# Patient Record
Sex: Female | Born: 1955
Health system: Southern US, Community
[De-identification: ages and names within clinical notes are randomized; demographics above are authoritative.]

## PROBLEM LIST (undated history)

## (undated) DIAGNOSIS — C439 Malignant melanoma of skin, unspecified: Secondary | ICD-10-CM

## (undated) DIAGNOSIS — E785 Hyperlipidemia, unspecified: Secondary | ICD-10-CM

## (undated) DIAGNOSIS — I10 Essential (primary) hypertension: Secondary | ICD-10-CM

## (undated) DIAGNOSIS — R7303 Prediabetes: Secondary | ICD-10-CM

## (undated) DIAGNOSIS — M8580 Other specified disorders of bone density and structure, unspecified site: Secondary | ICD-10-CM

## (undated) DIAGNOSIS — Z800 Family history of malignant neoplasm of digestive organs: Secondary | ICD-10-CM

## (undated) DIAGNOSIS — Q899 Congenital malformation, unspecified: Secondary | ICD-10-CM

## (undated) HISTORY — DX: Hyperlipidemia, unspecified: E78.5

## (undated) HISTORY — DX: Congenital malformation, unspecified: Q89.9

## (undated) HISTORY — DX: Essential (primary) hypertension: I10

## (undated) HISTORY — DX: Malignant melanoma of skin, unspecified: C43.9

## (undated) HISTORY — DX: Family history of malignant neoplasm of digestive organs: Z80.0

## (undated) HISTORY — DX: Other specified disorders of bone density and structure, unspecified site: M85.80

## (undated) HISTORY — DX: Prediabetes: R73.03

---

## 1975-04-17 DIAGNOSIS — K759 Inflammatory liver disease, unspecified: Secondary | ICD-10-CM

## 1975-04-17 HISTORY — DX: Inflammatory liver disease, unspecified: K75.9

## 2006-08-16 ENCOUNTER — Emergency Department: Payer: Self-pay | Admitting: Emergency Medicine

## 2015-01-31 HISTORY — PX: COLONOSCOPY: SHX174

## 2017-06-10 DIAGNOSIS — M7742 Metatarsalgia, left foot: Secondary | ICD-10-CM | POA: Insufficient documentation

## 2017-06-10 DIAGNOSIS — R202 Paresthesia of skin: Secondary | ICD-10-CM | POA: Insufficient documentation

## 2017-06-10 DIAGNOSIS — M7741 Metatarsalgia, right foot: Secondary | ICD-10-CM | POA: Insufficient documentation

## 2019-07-07 DIAGNOSIS — Z1231 Encounter for screening mammogram for malignant neoplasm of breast: Secondary | ICD-10-CM | POA: Diagnosis not present

## 2019-07-30 DIAGNOSIS — N3001 Acute cystitis with hematuria: Secondary | ICD-10-CM | POA: Diagnosis not present

## 2019-08-01 ENCOUNTER — Other Ambulatory Visit: Payer: Self-pay

## 2019-08-01 DIAGNOSIS — R112 Nausea with vomiting, unspecified: Secondary | ICD-10-CM | POA: Diagnosis not present

## 2019-08-01 DIAGNOSIS — R197 Diarrhea, unspecified: Secondary | ICD-10-CM | POA: Insufficient documentation

## 2019-08-01 LAB — CBC
HCT: 47.2 % — ABNORMAL HIGH (ref 36.0–46.0)
Hemoglobin: 15.5 g/dL — ABNORMAL HIGH (ref 12.0–15.0)
MCH: 31 pg (ref 26.0–34.0)
MCHC: 32.8 g/dL (ref 30.0–36.0)
MCV: 94.4 fL (ref 80.0–100.0)
Platelets: 328 10*3/uL (ref 150–400)
RBC: 5 MIL/uL (ref 3.87–5.11)
RDW: 13.2 % (ref 11.5–15.5)
WBC: 13.3 10*3/uL — ABNORMAL HIGH (ref 4.0–10.5)
nRBC: 0 % (ref 0.0–0.2)

## 2019-08-01 NOTE — ED Triage Notes (Signed)
Patient reports nausea, vomiting and diarrhea that started after eating supper around 8 pm.

## 2019-08-02 ENCOUNTER — Emergency Department
Admission: EM | Admit: 2019-08-02 | Discharge: 2019-08-02 | Disposition: A | Discharge status: Home / Self Care | Payer: Medicare PPO | Attending: Emergency Medicine | Admitting: Emergency Medicine

## 2019-08-02 DIAGNOSIS — R112 Nausea with vomiting, unspecified: Secondary | ICD-10-CM

## 2019-08-02 DIAGNOSIS — R197 Diarrhea, unspecified: Secondary | ICD-10-CM

## 2019-08-02 LAB — URINALYSIS, COMPLETE (UACMP) WITH MICROSCOPIC
Bacteria, UA: NONE SEEN
Bilirubin Urine: NEGATIVE
Glucose, UA: NEGATIVE mg/dL
Hgb urine dipstick: NEGATIVE
Ketones, ur: NEGATIVE mg/dL
Nitrite: NEGATIVE
Protein, ur: 30 mg/dL — AB
Specific Gravity, Urine: 1.026 (ref 1.005–1.030)
pH: 5 (ref 5.0–8.0)

## 2019-08-02 LAB — COMPREHENSIVE METABOLIC PANEL
ALT: 39 U/L (ref 0–44)
AST: 35 U/L (ref 15–41)
Albumin: 4.6 g/dL (ref 3.5–5.0)
Alkaline Phosphatase: 94 U/L (ref 38–126)
Anion gap: 14 (ref 5–15)
BUN: 26 mg/dL — ABNORMAL HIGH (ref 8–23)
CO2: 23 mmol/L (ref 22–32)
Calcium: 9.6 mg/dL (ref 8.9–10.3)
Chloride: 103 mmol/L (ref 98–111)
Creatinine, Ser: 1.3 mg/dL — ABNORMAL HIGH (ref 0.44–1.00)
GFR calc Af Amer: 50 mL/min — ABNORMAL LOW (ref >60)
GFR calc non Af Amer: 43 mL/min — ABNORMAL LOW (ref >60)
Glucose, Bld: 171 mg/dL — ABNORMAL HIGH (ref 70–99)
Potassium: 4.4 mmol/L (ref 3.5–5.1)
Sodium: 140 mmol/L (ref 135–145)
Total Bilirubin: 1 mg/dL (ref 0.3–1.2)
Total Protein: 8.3 g/dL — ABNORMAL HIGH (ref 6.5–8.1)

## 2019-08-02 LAB — LIPASE, BLOOD: Lipase: 54 U/L — ABNORMAL HIGH (ref 11–51)

## 2019-08-02 MED ORDER — ONDANSETRON HCL 4 MG/2ML IJ SOLN
4.0000 mg | Freq: Once | INTRAMUSCULAR | Status: AC
  Administered 2019-08-02: 4 mg via INTRAVENOUS

## 2019-08-02 MED ORDER — ONDANSETRON 4 MG PO TBDP: 4.0000 mg | ORAL_TABLET | Freq: Three times a day (TID) | ORAL | 0 refills | Status: DC | PRN

## 2019-08-02 MED ORDER — SODIUM CHLORIDE 0.9 % IV BOLUS: 1000.0000 mL | Freq: Once | INTRAVENOUS | Status: DC

## 2019-08-02 MED ORDER — ONDANSETRON HCL 4 MG/2ML IJ SOLN
4.0000 mg | Freq: Once | INTRAMUSCULAR | Status: DC
  Filled 2019-08-02: qty 2

## 2019-08-02 MED ORDER — SODIUM CHLORIDE 0.9 % IV BOLUS
1000.0000 mL | Freq: Once | INTRAVENOUS | Status: AC
  Administered 2019-08-02: 1000 mL via INTRAVENOUS

## 2019-08-02 NOTE — ED Notes (Signed)
Pt given ice chips to tolerate.

## 2019-08-02 NOTE — ED Provider Notes (Signed)
[facility] Emergency Department Provider Note  ____________________________________________   First MD Initiated Contact with Patient 08/02/19 [phone]     (approximate)  I have reviewed the triage vital signs and the nursing notes.   HISTORY  Chief Complaint Emesis and Nausea   HPI Maygen Suddith is a 64 y.o. female presents to the emergency department due to acute onset of nausea nonbloody emesis and diarrhea that began after eating steak out tonight at 8 PM.  Patient denies any abdominal pain no fever.  Patient denies any urinary symptoms.  Patient has not had any emesis emesis or diarrhea while in the emergency department.        No past medical history on file.  There are no problems to display for this patient.    Prior to Admission medications   Medication Sig Start Date End Date Taking? Authorizing Provider  ondansetron (ZOFRAN ODT) 4 MG disintegrating tablet Take 1 tablet (4 mg total) by mouth every 8 (eight) hours as needed. 08/02/19   Gregor Hams, MD    Allergies Streptomycin  No family history on file.  Social History Social History   Tobacco Use  . Smoking status: Not on file  Substance Use Topics  . Alcohol use: Not on file  . Drug use: Not on file    Review of Systems Constitutional: No fever/chills Eyes: No visual changes. ENT: No sore throat. Cardiovascular: Denies chest pain. Respiratory: Denies shortness of breath. Gastrointestinal: No abdominal pain, positive for nausea vomiting and diarrhea Genitourinary: Negative for dysuria. Musculoskeletal: Negative for neck pain.  Negative for back pain. Integumentary: Negative for rash. Neurological: Negative for headaches, focal weakness or numbness. ____________________   PHYSICAL EXAM:  VITAL SIGNS: ED Triage Vitals  Enc Vitals Group     BP 08/01/19 2341 124/68     Pulse Rate 08/01/19 2341 94     Resp 08/01/19 2341 16     Temp 08/01/19 2341 98.1 F (36.7  C)     Temp Source 08/01/19 2341 Oral     SpO2 08/01/19 2341 99 %     Weight 08/01/19 2342 81.6 kg (180 lb)     Height 08/01/19 2342 1.626 m (5\' 4" )     Head Circumference --      Peak Flow --      Pain Score 08/02/19 0641 0     Pain Loc --      Pain Edu? --      Excl. in [place]? --     Constitutional: Alert and oriented.  Eyes: Conjunctivae are normal.  Mouth/Throat: Patient is wearing a mask. Neck: No stridor.  No meningeal signs.   Cardiovascular: Normal rate, regular rhythm. Good peripheral circulation. Grossly normal heart sounds. Respiratory: Normal respiratory effort.  No retractions. Gastrointestinal: Soft and nontender. No distention.  Musculoskeletal: No lower extremity tenderness nor edema. No gross deformities of extremities. Neurologic:  Normal speech and language. No gross focal neurologic deficits are appreciated.  Skin:  Skin is warm, dry and intact. Psychiatric: Mood and affect are normal. Speech and behavior are normal.  ____________________________________________   LABS (all labs ordered are listed, but only abnormal results are displayed)  Labs Reviewed  LIPASE, BLOOD - Abnormal; Notable for the following components:      Result Value   Lipase 54 (*)    All other components within normal limits  COMPREHENSIVE METABOLIC PANEL - Abnormal; Notable for the following components:   Glucose, Bld 171 (*)    BUN  26 (*)    Creatinine, Ser 1.30 (*)    Total Protein 8.3 (*)    GFR calc non Af Amer 43 (*)    GFR calc Af Amer 50 (*)    All other components within normal limits  CBC - Abnormal; Notable for the following components:   WBC 13.3 (*)    Hemoglobin 15.5 (*)    HCT 47.2 (*)    All other components within normal limits  URINALYSIS, COMPLETE (UACMP) WITH MICROSCOPIC - Abnormal; Notable for the following components:   Color, Urine YELLOW (*)    APPearance HAZY (*)    Protein, ur 30 (*)    Leukocytes,Ua SMALL (*)    Non Squamous Epithelial PRESENT (*)      All other components within normal limits     Procedures   ____________________________________________   INITIAL IMPRESSION / MDM / ASSESSMENT AND PLAN / ED COURSE  As part of my medical decision making, I reviewed the following data within the electronic MEDICAL RECORD NUMBER   64 year old female presented with above-stated history and physical exam a differential diagnosis including but not limited to gastroenteritis of an infectious etiology.  Patient with no further vomiting or diarrhea while in the emergency department and as such stool sample was not able to be obtained.  Patient received IV Zofran 4 mg and 1 L IV normal saline with complete resolution of nausea.  Patient stating that she feels better at this time. ____________________________________________  FINAL CLINICAL IMPRESSION(S) / ED DIAGNOSES  Final diagnoses:  Nausea vomiting and diarrhea     MEDICATIONS GIVEN DURING THIS VISIT:  Medications  sodium chloride 0.9 % bolus 1,000 mL (0 mLs Intravenous Stopped 08/02/19 0530)  ondansetron (ZOFRAN) injection 4 mg (4 mg Intravenous Given 08/02/19 0356)     ED Discharge Orders         Ordered    ondansetron (ZOFRAN ODT) 4 MG disintegrating tablet  Every 8 hours PRN     08/02/19 0622          *Please note:  Zonie Sirois was evaluated in Emergency Department on 08/02/2019 for the symptoms described in the history of present illness. She was evaluated in the context of the global COVID-19 pandemic, which necessitated consideration that the patient might be at risk for infection with the SARS-CoV-2 virus that causes COVID-19. Institutional protocols and algorithms that pertain to the evaluation of patients at risk for COVID-19 are in a state of rapid change based on information released by regulatory bodies including the CDC and federal and state organizations. These policies and algorithms were followed during the patient's care in the ED.  Some ED evaluations and  interventions may be delayed as a result of limited staffing during the pandemic.*  Note:  This document was prepared using Dragon voice recognition software and may include unintentional dictation errors.   Gregor Hams, MD 08/02/19 2242

## 2019-08-02 NOTE — ED Notes (Signed)
Pt out to desk with spouse who state they need to leave and attend to their dog. Pt's iv removed by melody. Pt informed that she has not been officially discharged and will not receive her prescription or discharge paper work if she chooses to leave. Pt verbalizes understanding.

## 2019-08-02 NOTE — ED Notes (Signed)
DC iv

## 2019-08-02 NOTE — ED Notes (Signed)
Pt to the er for nausea and vomiting. Pt denies abd pain. Pt reports eating a hot dog with mustard, chilli and slaw and then a chicken salad sandwich which she vomited both.

## 2019-08-13 DIAGNOSIS — R5383 Other fatigue: Secondary | ICD-10-CM | POA: Diagnosis not present

## 2019-08-13 DIAGNOSIS — R630 Anorexia: Secondary | ICD-10-CM | POA: Diagnosis not present

## 2019-08-13 DIAGNOSIS — R112 Nausea with vomiting, unspecified: Secondary | ICD-10-CM | POA: Diagnosis not present

## 2019-08-13 DIAGNOSIS — R197 Diarrhea, unspecified: Secondary | ICD-10-CM | POA: Diagnosis not present

## 2019-08-13 DIAGNOSIS — R42 Dizziness and giddiness: Secondary | ICD-10-CM | POA: Diagnosis not present

## 2019-08-13 DIAGNOSIS — Z87891 Personal history of nicotine dependence: Secondary | ICD-10-CM | POA: Diagnosis not present

## 2019-08-13 DIAGNOSIS — E860 Dehydration: Secondary | ICD-10-CM | POA: Diagnosis not present

## 2019-08-14 ENCOUNTER — Other Ambulatory Visit: Payer: Self-pay

## 2019-08-14 ENCOUNTER — Emergency Department
Admission: EM | Admit: 2019-08-14 | Discharge: 2019-08-15 | Disposition: A | Discharge status: Home / Self Care | Payer: Medicare PPO | Attending: Emergency Medicine | Admitting: Emergency Medicine

## 2019-08-14 DIAGNOSIS — Z6831 Body mass index (BMI) 31.0-31.9, adult: Secondary | ICD-10-CM | POA: Diagnosis not present

## 2019-08-14 DIAGNOSIS — R1030 Lower abdominal pain, unspecified: Secondary | ICD-10-CM | POA: Diagnosis not present

## 2019-08-14 DIAGNOSIS — R109 Unspecified abdominal pain: Secondary | ICD-10-CM | POA: Diagnosis not present

## 2019-08-14 DIAGNOSIS — R110 Nausea: Secondary | ICD-10-CM | POA: Diagnosis not present

## 2019-08-14 DIAGNOSIS — R630 Anorexia: Secondary | ICD-10-CM | POA: Diagnosis present

## 2019-08-14 LAB — CBC
HCT: 41.7 % (ref 36.0–46.0)
Hemoglobin: 14.1 g/dL (ref 12.0–15.0)
MCH: 31.1 pg (ref 26.0–34.0)
MCHC: 33.8 g/dL (ref 30.0–36.0)
MCV: 92.1 fL (ref 80.0–100.0)
Platelets: 274 10*3/uL (ref 150–400)
RBC: 4.53 MIL/uL (ref 3.87–5.11)
RDW: 13.2 % (ref 11.5–15.5)
WBC: 11.2 10*3/uL — ABNORMAL HIGH (ref 4.0–10.5)
nRBC: 0 % (ref 0.0–0.2)

## 2019-08-14 LAB — COMPREHENSIVE METABOLIC PANEL
ALT: 31 U/L (ref 0–44)
AST: 24 U/L (ref 15–41)
Albumin: 4.1 g/dL (ref 3.5–5.0)
Alkaline Phosphatase: 71 U/L (ref 38–126)
Anion gap: 12 (ref 5–15)
BUN: 15 mg/dL (ref 8–23)
CO2: 26 mmol/L (ref 22–32)
Calcium: 9.2 mg/dL (ref 8.9–10.3)
Chloride: 102 mmol/L (ref 98–111)
Creatinine, Ser: 1.08 mg/dL — ABNORMAL HIGH (ref 0.44–1.00)
GFR calc Af Amer: >60 mL/min (ref >60)
GFR calc non Af Amer: 53 mL/min — ABNORMAL LOW (ref >60)
Glucose, Bld: 113 mg/dL — ABNORMAL HIGH (ref 70–99)
Potassium: 3.7 mmol/L (ref 3.5–5.1)
Sodium: 140 mmol/L (ref 135–145)
Total Bilirubin: 1.1 mg/dL (ref 0.3–1.2)
Total Protein: 7 g/dL (ref 6.5–8.1)

## 2019-08-14 LAB — LIPASE, BLOOD: Lipase: 80 U/L — ABNORMAL HIGH (ref 11–51)

## 2019-08-14 MED ORDER — IOHEXOL 9 MG/ML PO SOLN
500.0000 mL | ORAL | Status: AC
  Administered 2019-08-14: 23:00:00 500 mL via ORAL

## 2019-08-14 NOTE — ED Provider Notes (Signed)
-----------------------------------------   11:20 PM on 08/14/2019 -----------------------------------------  Blood pressure (!) 108/49, pulse 66, temperature 98.1 F (36.7 C), temperature source Oral, resp. rate 18, height 5\' 4"  (1.626 m), weight 81.6 kg, SpO2 100 %.  Assuming care from Dr. Cherylann Banas.  In short, Leslie Woods is a 64 y.o. female with a chief complaint of Abdominal Pain .  Refer to the original H&P for additional details.  The current plan of care is to follow-up CT results for abdominal pain, may be discharged home if unremarkable.  ----------------------------------------- 1:11 AM on 08/15/2019 -----------------------------------------  CT scan is negative for acute process and remainder of lab work is unremarkable.  Patient describes her pain as a fullness particularly after eating and we will start her on a trial of PPI.  She was counseled to follow-up with her PCP and otherwise return to the ED for new worsening symptoms.  Patient agrees with plan.    Blake Divine, MD 08/15/19 0111

## 2019-08-14 NOTE — ED Notes (Signed)
Pt reports was on antibiotics recently for UTI. Pt reports was inconsistent with taking her antibiotic pills. Notified urine sample needed; states will notify this RN once she can provide sample. Bed locked low. Rail up. Call bell within reach.

## 2019-08-14 NOTE — ED Triage Notes (Signed)
Pt states two weeks of lower abd pain with nausea. Pt states was seen here 2 weeks ago for same and seen [facility] for same. Pt states took zofran pta. Pt appears in no acute distress.

## 2019-08-15 ENCOUNTER — Emergency Department: Payer: Medicare PPO

## 2019-08-15 DIAGNOSIS — R109 Unspecified abdominal pain: Secondary | ICD-10-CM | POA: Diagnosis not present

## 2019-08-15 LAB — URINALYSIS, COMPLETE (UACMP) WITH MICROSCOPIC
Bilirubin Urine: NEGATIVE
Glucose, UA: NEGATIVE mg/dL
Hgb urine dipstick: NEGATIVE
Ketones, ur: NEGATIVE mg/dL
Leukocytes,Ua: NEGATIVE
Nitrite: NEGATIVE
Protein, ur: NEGATIVE mg/dL
Specific Gravity, Urine: 1.005 (ref 1.005–1.030)
pH: 6 (ref 5.0–8.0)

## 2019-08-15 IMAGING — CT CT ABD-PELV W/ CM
2 of 5 series · 16 of 46 positions shown, 18 images · IV contrast (omnipaque)
Comparison: None.

CLINICAL DATA: Lower abdominal pain and nausea

EXAM:
CT ABDOMEN AND PELVIS WITH CONTRAST
TECHNIQUE: Multidetector CT imaging of the abdomen and pelvis was performed
using the standard protocol following bolus administration of
intravenous contrast.
CONTRAST:  100mL OMNIPAQUE IOHEXOL 300 MG/ML  SOLN

[Series 2: routine abd/pel with · axial · 0.87mm/px · z∈[-1063,-638]mm · 13 of 96 slices shown, 15 images]
[im 6/96  soft-tissue]
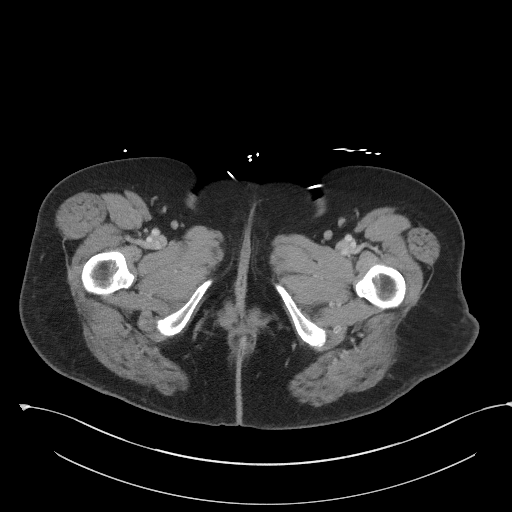
[im 6/96  bone]
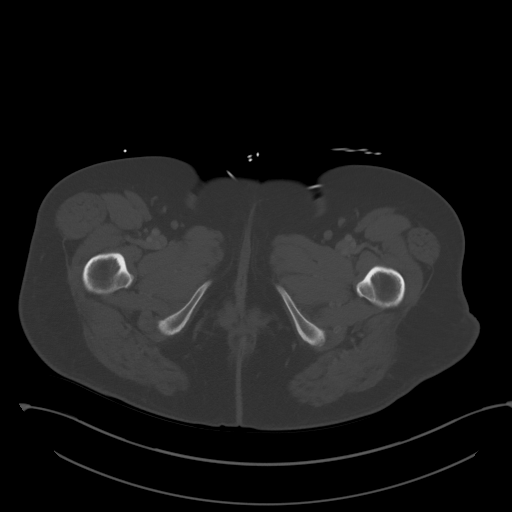
[im 16/96  soft-tissue]
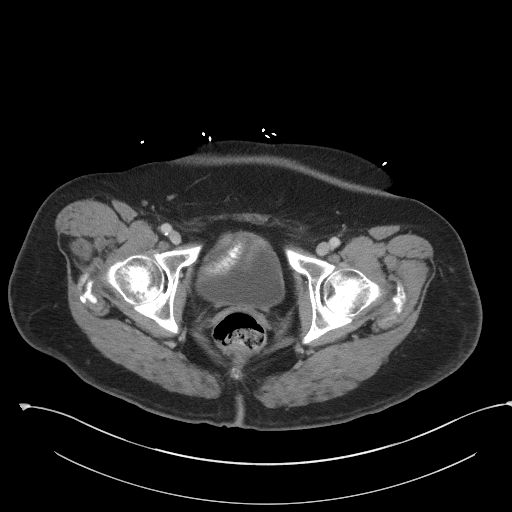
[im 21/96  soft-tissue]
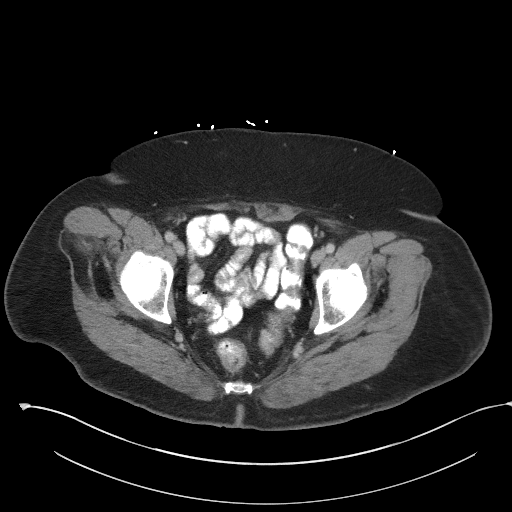
[im 26/96  soft-tissue]
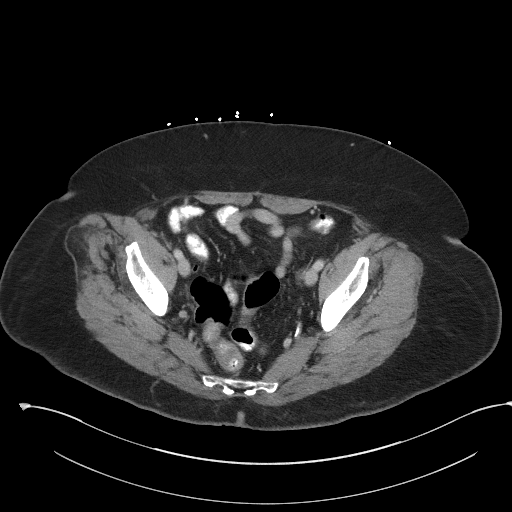
[im 36/96  soft-tissue]
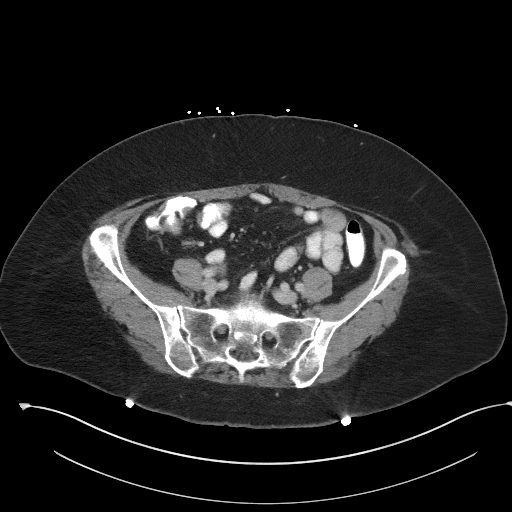
[im 41/96  soft-tissue]
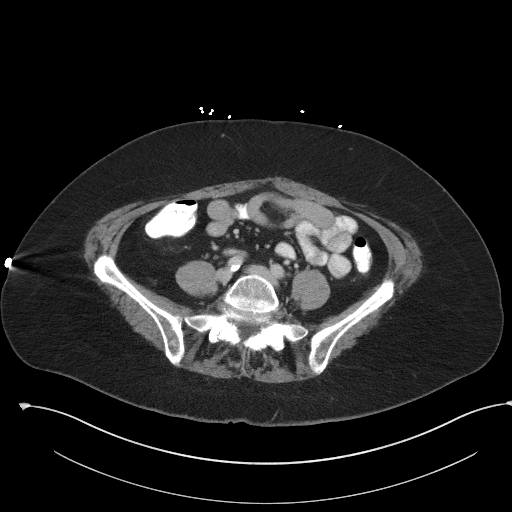
[im 51/96  soft-tissue]
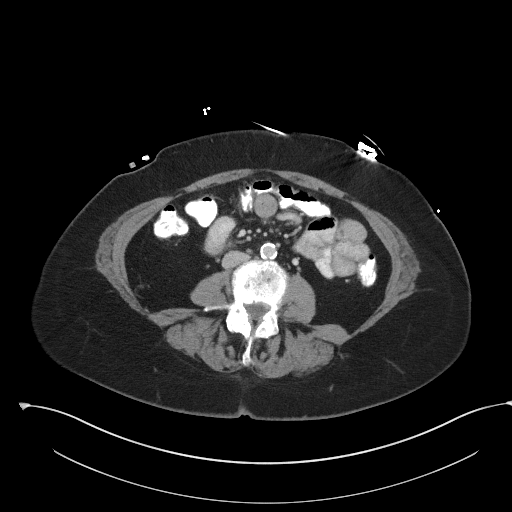
[im 56/96  soft-tissue]
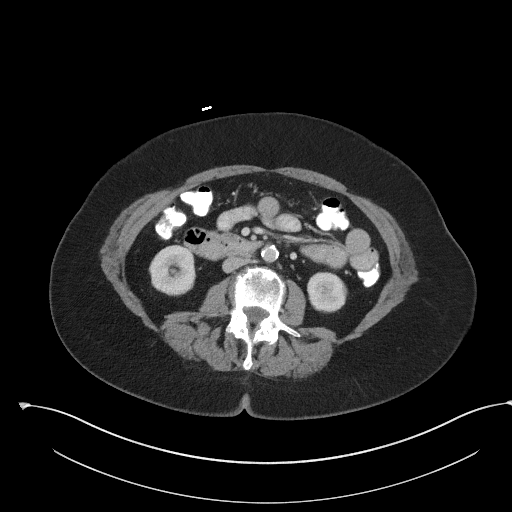
[im 61/96  soft-tissue]
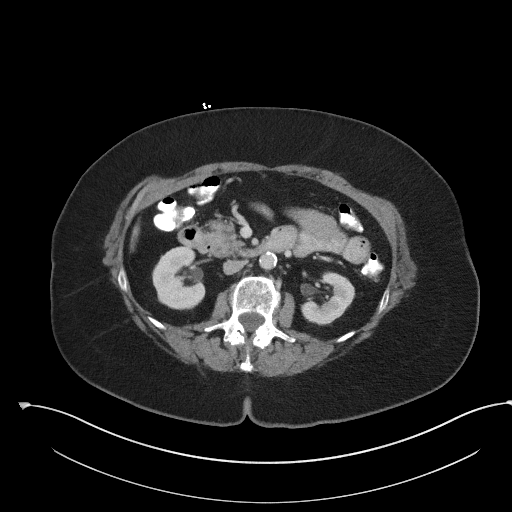
[im 61/96  bone]
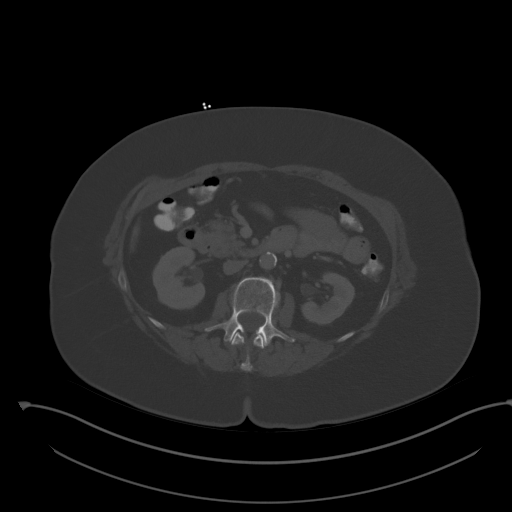
[im 71/96  soft-tissue]
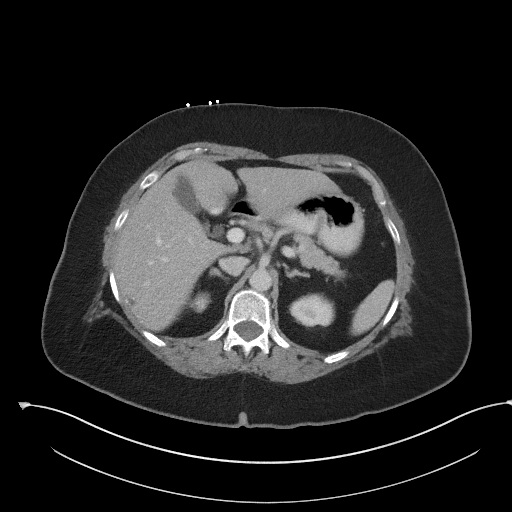
[im 76/96  soft-tissue]
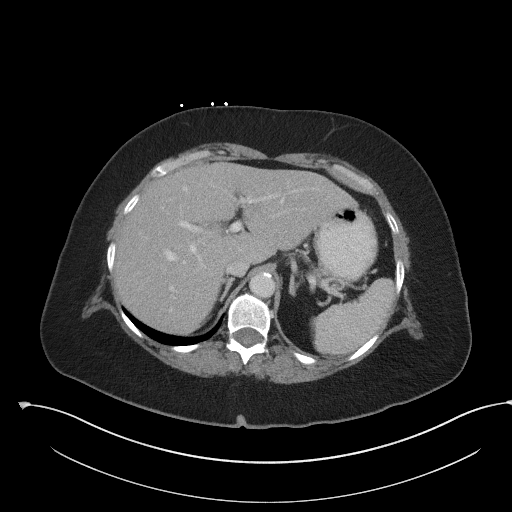
[im 81/96  soft-tissue]
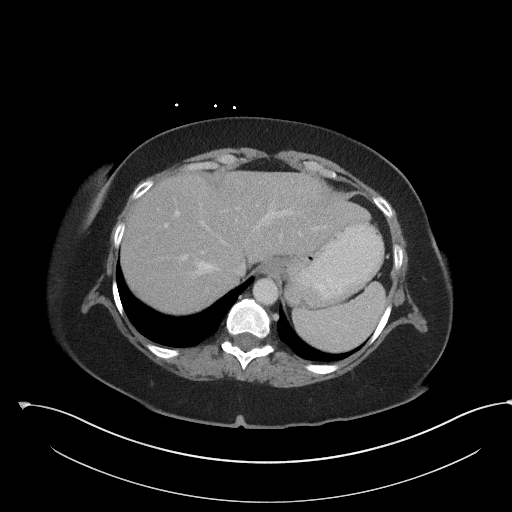
[im 91/96  soft-tissue]
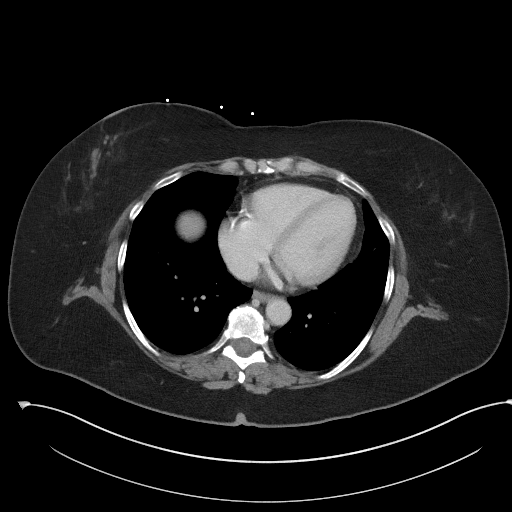

[Series 5: coronal st · coronal · 0.87mm/px · 3 of 87 slices shown]
[im 29/87  soft-tissue]
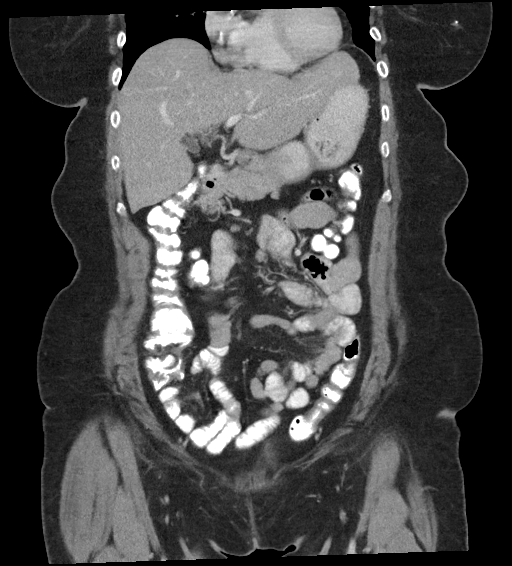
[im 39/87  soft-tissue]
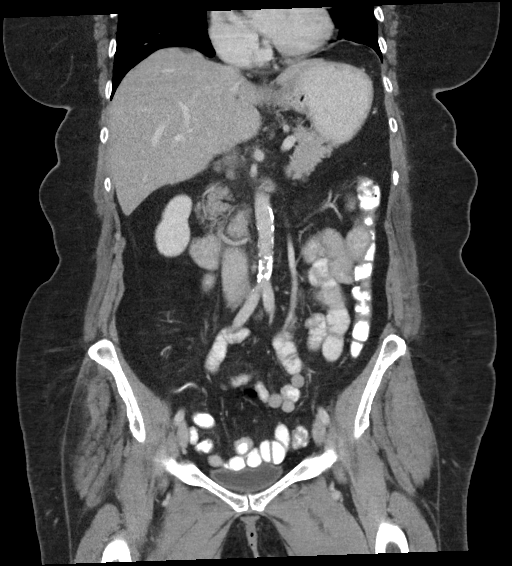
[im 48/87  soft-tissue]
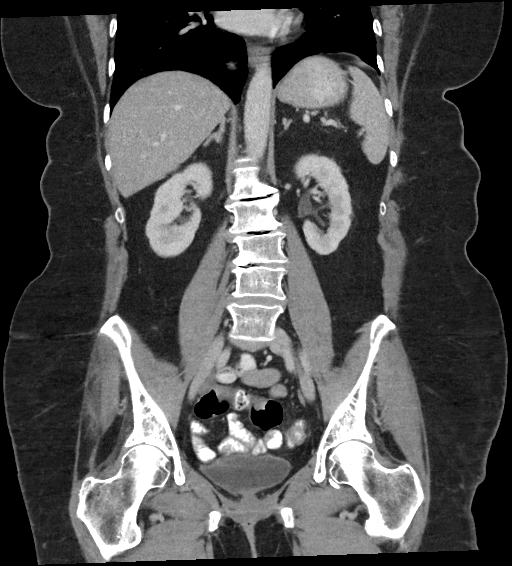

[16 of 46 positions shown; findings below may reference images not displayed]

FINDINGS: Lower chest: The visualized heart size within normal limits. No
pericardial fluid/thickening.

No hiatal hernia.

The visualized portions of the lungs are clear.

Hepatobiliary: The liver is normal in density without focal
abnormality.The main portal vein is patent. No evidence of calcified
gallstones, gallbladder wall thickening or biliary dilatation.

Pancreas:  The pancreas is unremarkable.

Spleen: Normal in size without focal abnormality.

Adrenals/Urinary Tract: Both adrenal glands appear normal. The
kidneys and collecting system appear normal without evidence of
urinary tract calculus or hydronephrosis. Bladder is unremarkable.

Stomach/Bowel: The stomach, small bowel, and colon are normal in
appearance. No inflammatory changes, wall thickening, or obstructive
findings.Scattered colonic diverticula are noted without
diverticulitis. The appendix is unremarkable.

Vascular/Lymphatic: There are no enlarged mesenteric,
retroperitoneal, or pelvic lymph nodes. Scattered aortic
atherosclerotic calcifications are seen without aneurysmal
dilatation.

Reproductive: The patient is status post hysterectomy. No adnexal
masses or collections seen.

Other: No evidence of abdominal wall mass or hernia.

Musculoskeletal: No acute or significant osseous findings.
IMPRESSION: No acute intra-abdominal or pelvic pathology to explain the
patient's symptoms.

Scattered colonic diverticula without diverticulitis.

Aortic Atherosclerosis ([8K]-[8K]).

## 2019-08-15 MED ORDER — IOHEXOL 300 MG/ML  SOLN
100.0000 mL | Freq: Once | INTRAMUSCULAR | Status: AC | PRN
  Administered 2019-08-15: 100 mL via INTRAVENOUS

## 2019-08-15 MED ORDER — OMEPRAZOLE 20 MG PO CPDR: 20.0000 mg | DELAYED_RELEASE_CAPSULE | Freq: Every day | ORAL | 1 refills | Status: DC

## 2019-08-15 NOTE — ED Notes (Signed)
Patient taken to CT scan.

## 2019-08-15 NOTE — ED Notes (Signed)
Patient assisted to bathroom 

## 2019-08-15 NOTE — ED Provider Notes (Signed)
[facility] Emergency Department Provider Note ____________________________________________   First MD Initiated Contact with Patient 08/14/19 2319     (approximate)  I have reviewed the triage vital signs and the nursing notes.   HISTORY  Chief Complaint Abdominal Pain    HPI Leslie Woods is a 64 y.o. female with PMH as noted below who presents with decreased appetite, lower abdominal discomfort, and some diarrhea over the last 2 weeks.  The patient states the symptoms initially started after she took antibiotics for UTI.  She developed vomiting and was seen in the ED at that time.  She states that the symptoms gradually improved although she still has had a poor appetite and has felt somewhat weak.  Then today, she started having loose stools again and some lower abdominal discomfort.  She denies any recent vomiting.  She has had no fever or chills, no recurrent urinary symptoms.  History reviewed. No pertinent past medical history.  There are no problems to display for this patient.     Prior to Admission medications   Medication Sig Start Date End Date Taking? Authorizing Provider  ondansetron (ZOFRAN ODT) 4 MG disintegrating tablet Take 1 tablet (4 mg total) by mouth every 8 (eight) hours as needed. 08/02/19   Gregor Hams, MD    Allergies Streptomycin  No family history on file.  Social History Social History   Tobacco Use  . Smoking status: Not on file  Substance Use Topics  . Alcohol use: Not on file  . Drug use: Not on file    Review of Systems  Constitutional: No fever/chills. Eyes: No visual changes. ENT: No sore throat. Cardiovascular: Denies chest pain. Respiratory: Denies shortness of breath. Gastrointestinal: Positive for diarrhea. Genitourinary: Negative for dysuria.  Musculoskeletal: Negative for back pain. Skin: Negative for rash. Neurological: Negative for  headache.   ____________________________________________   PHYSICAL EXAM:  VITAL SIGNS: ED Triage Vitals  Enc Vitals Group     BP 08/14/19 2039 132/84     Pulse Rate 08/14/19 2039 76     Resp 08/14/19 2039 16     Temp 08/14/19 2039 98.1 F (36.7 C)     Temp Source 08/14/19 2039 Oral     SpO2 08/14/19 2039 100 %     Weight 08/14/19 2040 180 lb (81.6 kg)     Height 08/14/19 2040 5\' 4"  (1.626 m)     Head Circumference --      Peak Flow --      Pain Score 08/14/19 2039 5     Pain Loc --      Pain Edu? --      Excl. in [place]? --     Constitutional: Alert and oriented.  Relatively well appearing and in no acute distress. Eyes: Conjunctivae are normal.  No scleral icterus. Head: Atraumatic. Nose: No congestion/rhinnorhea. Mouth/Throat: Mucous membranes are moist.   Neck: Normal range of motion.  Cardiovascular: Normal rate, regular rhythm. Good peripheral circulation. Respiratory: Normal respiratory effort.  No retractions.  Gastrointestinal: Soft with mild bilateral lower quadrant discomfort.  No distention.  Genitourinary: No flank tenderness. Musculoskeletal: No lower extremity edema.  Extremities warm and well perfused.  Neurologic:  Normal speech and language. No gross focal neurologic deficits are appreciated.  Skin:  Skin is warm and dry. No rash noted. Psychiatric: Mood and affect are normal. Speech and behavior are normal.  ____________________________________________   LABS (all labs ordered are listed, but only abnormal results are displayed)  Labs  Reviewed  COMPREHENSIVE METABOLIC PANEL - Abnormal; Notable for the following components:      Result Value   Glucose, Bld 113 (*)    Creatinine, Ser 1.08 (*)    GFR calc non Af Amer 53 (*)    All other components within normal limits  CBC - Abnormal; Notable for the following components:   WBC 11.2 (*)    All other components within normal limits  URINALYSIS, COMPLETE (UACMP) WITH MICROSCOPIC - Abnormal; Notable  for the following components:   Color, Urine STRAW (*)    APPearance CLEAR (*)    Bacteria, UA RARE (*)    All other components within normal limits  LIPASE, BLOOD - Abnormal; Notable for the following components:   Lipase 80 (*)    All other components within normal limits   ____________________________________________  EKG   ____________________________________________  RADIOLOGY  CT abdomen: Pending  ____________________________________________   PROCEDURES  Procedure(s) performed: No  Procedures  Critical Care performed: No ____________________________________________   INITIAL IMPRESSION / ASSESSMENT AND PLAN / ED COURSE  Pertinent labs & imaging results that were available during my care of the patient were reviewed by me and considered in my medical decision making (see chart for details).  64 year old female with no active medical problems presents with decreased appetite, intermittent nausea, and weakness over the last few weeks, as well as recurrent diarrhea and lower abdominal discomfort today.  I reviewed the past medical records in [place].  The patient was seen in the ED for the symptoms initially on 4/18.  Lab work-up was unremarkable at that time and she did well with IV fluids and Zofran.  She had no imaging at that time.  On exam today, the patient is overall well-appearing and her vital signs are normal.  She has mild bilateral lower quadrant discomfort to palpation.  The remainder of the exam is unremarkable.  Overall I suspect relatively benign etiology given the subacute nature of the symptoms, such as gastritis, enteritis, or possible IBS.  However given the patient's age and the persistent nature of the symptoms as well as the recurrent diarrhea and lower abdominal discomfort, I feel that imaging is warranted.  We will proceed with CT abdomen.  If this shows no acute findings and the lab work-up is reassuring, anticipate discharge home with plan for GI  follow-up.  ----------------------------------------- 12:31 AM on 08/15/2019 -----------------------------------------  Lab work-up is unremarkable except for minimally elevated lipase which I do not feel is a significant contributor to the patient's symptoms.  CT is pending.  I have signed her out to the oncoming physician Dr. Charna Archer.  ____________________________________________   FINAL CLINICAL IMPRESSION(S) / ED DIAGNOSES  Final diagnoses:  Lower abdominal pain      NEW MEDICATIONS STARTED DURING THIS VISIT:  New Prescriptions   No medications on file     Note:  This document was prepared using Dragon voice recognition software and may include unintentional dictation errors.    Arta Silence, MD 08/15/19 [phone]

## 2019-08-19 DIAGNOSIS — R5383 Other fatigue: Secondary | ICD-10-CM | POA: Diagnosis not present

## 2019-08-19 DIAGNOSIS — K2100 Gastro-esophageal reflux disease with esophagitis, without bleeding: Secondary | ICD-10-CM | POA: Diagnosis not present

## 2019-10-05 DIAGNOSIS — Z2821 Immunization not carried out because of patient refusal: Secondary | ICD-10-CM | POA: Diagnosis not present

## 2019-10-05 DIAGNOSIS — Z0000 Encounter for general adult medical examination without abnormal findings: Secondary | ICD-10-CM | POA: Diagnosis not present

## 2019-10-05 DIAGNOSIS — Z6831 Body mass index (BMI) 31.0-31.9, adult: Secondary | ICD-10-CM | POA: Diagnosis not present

## 2019-10-05 DIAGNOSIS — I10 Essential (primary) hypertension: Secondary | ICD-10-CM | POA: Diagnosis not present

## 2019-10-05 DIAGNOSIS — R7301 Impaired fasting glucose: Secondary | ICD-10-CM | POA: Diagnosis not present

## 2019-10-05 DIAGNOSIS — Z90710 Acquired absence of both cervix and uterus: Secondary | ICD-10-CM | POA: Diagnosis not present

## 2019-10-05 DIAGNOSIS — E669 Obesity, unspecified: Secondary | ICD-10-CM | POA: Diagnosis not present

## 2019-10-05 DIAGNOSIS — F329 Major depressive disorder, single episode, unspecified: Secondary | ICD-10-CM | POA: Diagnosis not present

## 2019-10-05 DIAGNOSIS — E7800 Pure hypercholesterolemia, unspecified: Secondary | ICD-10-CM | POA: Diagnosis not present

## 2019-11-18 DIAGNOSIS — R110 Nausea: Secondary | ICD-10-CM | POA: Diagnosis not present

## 2019-11-18 DIAGNOSIS — I10 Essential (primary) hypertension: Secondary | ICD-10-CM | POA: Diagnosis not present

## 2019-11-18 DIAGNOSIS — R197 Diarrhea, unspecified: Secondary | ICD-10-CM | POA: Diagnosis not present

## 2019-11-18 DIAGNOSIS — G629 Polyneuropathy, unspecified: Secondary | ICD-10-CM | POA: Diagnosis not present

## 2019-11-18 DIAGNOSIS — Z87891 Personal history of nicotine dependence: Secondary | ICD-10-CM | POA: Diagnosis not present

## 2019-11-18 DIAGNOSIS — E785 Hyperlipidemia, unspecified: Secondary | ICD-10-CM | POA: Diagnosis not present

## 2019-11-19 DIAGNOSIS — R110 Nausea: Secondary | ICD-10-CM | POA: Diagnosis not present

## 2019-11-24 DIAGNOSIS — R110 Nausea: Secondary | ICD-10-CM | POA: Diagnosis not present

## 2019-11-24 DIAGNOSIS — D1809 Hemangioma of other sites: Secondary | ICD-10-CM | POA: Diagnosis not present

## 2019-11-24 DIAGNOSIS — R945 Abnormal results of liver function studies: Secondary | ICD-10-CM | POA: Diagnosis not present

## 2019-11-24 DIAGNOSIS — R748 Abnormal levels of other serum enzymes: Secondary | ICD-10-CM | POA: Diagnosis not present

## 2019-11-24 DIAGNOSIS — R932 Abnormal findings on diagnostic imaging of liver and biliary tract: Secondary | ICD-10-CM | POA: Diagnosis not present

## 2019-11-26 DIAGNOSIS — R109 Unspecified abdominal pain: Secondary | ICD-10-CM | POA: Diagnosis not present

## 2019-12-02 ENCOUNTER — Telehealth: Payer: Self-pay | Admitting: Gastroenterology

## 2019-12-02 NOTE — Telephone Encounter (Signed)
Spoke to patient who states that she had a bad case of diarrhea on 10/29/19 while at the beach,and has not had any since then. She has been in and out of the ER,  had labs and imaging for abdominal issues. Patient is due for a colonoscopy,office visit  appointment scheduled for 01/29/20 Records will be placed o Dr Leland Her desk for review. Patient knows to notify her PCP or go to the ER for any further abdominal issues. All questions answered. Patient voiced understanding.

## 2019-12-07 ENCOUNTER — Telehealth: Payer: Self-pay | Admitting: Gastroenterology

## 2019-12-09 ENCOUNTER — Ambulatory Visit: Payer: Medicare PPO | Admitting: Gastroenterology

## 2019-12-09 VITALS — BP 124/74 | HR 79 | Temp 97.3°F | Ht 64.0 in | Wt 172.4 lb

## 2019-12-09 DIAGNOSIS — R748 Abnormal levels of other serum enzymes: Secondary | ICD-10-CM

## 2019-12-09 DIAGNOSIS — R9389 Abnormal findings on diagnostic imaging of other specified body structures: Secondary | ICD-10-CM | POA: Diagnosis not present

## 2019-12-09 DIAGNOSIS — R1013 Epigastric pain: Secondary | ICD-10-CM

## 2019-12-09 DIAGNOSIS — R634 Abnormal weight loss: Secondary | ICD-10-CM | POA: Diagnosis not present

## 2019-12-09 DIAGNOSIS — K219 Gastro-esophageal reflux disease without esophagitis: Secondary | ICD-10-CM | POA: Diagnosis not present

## 2019-12-09 NOTE — Progress Notes (Signed)
Chief Complaint: Multiple GI complaints  Referring Provider:  Greig Right, MD      ASSESSMENT AND PLAN;   #1. GERD with occ dysphgia  #2. Abn US showing liver lesion with borderline elevated lipase, epi pain with wt loss (27lb over 6 months). Neg CT AP for pancreatitis.  Patient very much concerned about pancreatic malignancy  #3. FH colon cancer (mom at age 66)  Plan: -Protonix 40mg  po QD to continue -MRI pancreas with contrast. Pl include liver -EGD with dil and colon with miralax.  I explained risks and benefits in detail. -Can stop carafate in 2 weeks   HPI:    Leslie Woods is a 64 y.o. female  With DM2, HTN, HLD, CKD2, FH of colon cancer, HOH  With multiple GI complaints.  Accompanied by her sister.  Very poor historian  -Had epi pain with associated N/V/diarrhea after eating foot-long sandwich on August 01, 2019.  Since then continues to have GI problems.  Has been to ED x 4 at multiple different centers.  Was found to have elevated lipase as detailed below.  She underwent ultrasound which showed fatty liver, normal gallbladder, 1.4 cm lesion in the right lobe of the liver.  Most recently underwent CT Abdo/pelvis which was unremarkable.  She continues to have elevated lipase (not 3 times above normal limit)  -Recently has been having intermittent dysphagia, initially to pills especially vitamin C which she was taking before.  She can handle Metformin.  She also has intermittent dysphagia to solids now.  -Diarrhea has more or less resolved.  -During the course she had approximately 27 pound weight loss.  Some of it was intentional.  -No jaundice dark urine or pale stools.  No fever chills or night sweats.   Past GI procedures:  CT AP with contrast 08/15/2019 -No acute intra-abdominal or pelvic pathology to explain the patient's symptoms. -Scattered colonic diverticula without diverticulitis. -Aortic Atherosclerosis (ICD10-I70.0).  [place] 11/24/2019 -Fatty  liver. -1.4 cm right liver lesion likely a small hemangioma.  Colonoscopy 01/31/2015 ([facility]): Mild sigmoid diverticulosis.  Otherwise normal.  Repeat in 5 yrs d/t FH Past Medical History:  Diagnosis Date  . 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 Borderline diabetes   . Family history of colon cancer   . Hepatitis 1977   presumed Hep A  . Hyperlipidemia   . Hypertension   . Osteopenia   . Skin cancer (melanoma) ([place])    resected by [facility] Mccallum 2018    Past Surgical History:  Procedure Laterality Date  . COLONOSCOPY    . TOTAL ABDOMINAL HYSTERECTOMY  09/1998    Family History  Problem Relation Age of Onset  . Colon cancer Mother 67  . Breast cancer Sister   . Breast cancer Maternal Aunt     Social History   Tobacco Use  . Smoking status: Former Research scientist (life sciences)  . Smokeless tobacco: Never Used  Vaping Use  . Vaping Use: Never used  Substance Use Topics  . Alcohol use: Not Currently  . Drug use: Not Currently    Current Outpatient Medications  Medication Sig Dispense Refill  . pantoprazole (PROTONIX) 40 MG tablet Take 40 mg by mouth daily.    . sucralfate (CARAFATE) 1 g tablet 1 tablet 2 (two) times daily.     No current facility-administered medications for this visit.    Allergies  Allergen Reactions  . Streptomycin Other (See Comments)    unknown  Review of Systems:  Constitutional: Denies fever, chills, diaphoresis, appetite change and fatigue.  HEENT: Denies photophobia, eye pain, redness, hearing loss, ear pain, congestion, sore throat, rhinorrhea, sneezing, mouth sores, neck pain, neck stiffness and tinnitus.   Respiratory: Denies SOB, DOE, cough, chest tightness,  and wheezing.   Cardiovascular: Denies chest pain, palpitations and leg swelling.  Genitourinary: Denies dysuria, urgency, frequency, hematuria, flank pain and difficulty urinating.  Musculoskeletal: Denies myalgias, back pain, joint swelling, arthralgias and  gait problem.  Skin: No rash.  Neurological: Denies dizziness, seizures, syncope, weakness, light-headedness, numbness and headaches.  Hematological: Denies adenopathy. Easy bruising, personal or family bleeding history  Psychiatric/Behavioral: No anxiety or depression     Physical Exam:    BP 124/74   Pulse 79   Temp (!) 97.3 F (36.3 C)   Ht 5\' 4"  (1.626 m)   Wt 172 lb 6 oz (78.2 kg)   BMI 29.59 kg/m  Wt Readings from Last 3 Encounters:  12/09/19 172 lb 6 oz (78.2 kg)  08/14/19 180 lb (81.6 kg)  08/01/19 180 lb (81.6 kg)   Constitutional:  Well-developed, in no acute distress. Psychiatric: Normal mood and affect. Behavior is normal. HEENT: Pupils normal.  Conjunctivae are normal. No scleral icterus. Neck supple.  Cardiovascular: Normal rate, regular rhythm. No edema Pulmonary/chest: Effort normal and breath sounds normal. No wheezing, rales or rhonchi. Abdominal: Soft, nondistended. Nontender. Bowel sounds active throughout. There are no masses palpable. No hepatomegaly. Rectal:  defered Neurological: Alert and oriented to person place and time. Skin: Skin is warm and dry. No rashes noted.  Data Reviewed: I have personally reviewed following labs and imaging studies  CBC: CBC Latest Ref Rng & Units 08/14/2019 08/01/2019  WBC 4.0 - 10.5 K/uL 11.2(H) 13.3(H)  Hemoglobin 12.0 - 15.0 g/dL 14.1 15.5(H)  Hematocrit 36 - 46 % 41.7 47.2(H)  Platelets 150 - 400 K/uL 274 328    CMP: CMP Latest Ref Rng & Units 08/14/2019 08/01/2019  Glucose 70 - 99 mg/dL 113(H) 171(H)  BUN 8 - 23 mg/dL 15 26(H)  Creatinine 0.44 - 1.00 mg/dL 1.08(H) 1.30(H)  Sodium 135 - 145 mmol/L 140 140  Potassium 3.5 - 5.1 mmol/L 3.7 4.4  Chloride 98 - 111 mmol/L 102 103  CO2 22 - 32 mmol/L 26 23  Calcium 8.9 - 10.3 mg/dL 9.2 9.6  Total Protein 6.5 - 8.1 g/dL 7.0 8.3(H)  Total Bilirubin 0.3 - 1.2 mg/dL 1.1 1.0  Alkaline Phos 38 - 126 U/L 71 94  AST 15 - 41 U/L 24 35  ALT 0 - 44 U/L 31 39   Lipase      Component Value Date/Time   LIPASE 80 (H) 08/14/2019 2045    CT was independently reviewed and reviewed with the patient and patient's sister.   Carmell Austria, MD 12/09/2019, 9:05 AM  Cc: Greig Right, MD

## 2019-12-09 NOTE — Patient Instructions (Addendum)
If you are age 66 or older, your body mass index should be between 23-30. Your Body mass index is 29.59 kg/m. If this is out of the aforementioned range listed, please consider follow up with your Primary Care Provider.  If you are age 53 or younger, your body mass index should be between 19-25. Your Body mass index is 29.59 kg/m. If this is out of the aformentioned range listed, please consider follow up with your Primary Care Provider.   You have been scheduled for an MRI at [facility] on 12/16/19. Your appointment time is 6:30pm. Please arrive 15 minutes prior to your appointment time for registration purposes. Please make certain not to have anything to eat or drink 6 hours prior to your test. In addition, if you have any metal in your body, have a pacemaker or defibrillator, please be sure to let your ordering physician know. This test typically takes 45 minutes to 1 hour to complete. Should you need to reschedule, please call [phone] to do so.  You have been scheduled for an endoscopy and colonoscopy. Please follow the written instructions given to you at your visit today. Please pick up your prep supplies at the pharmacy within the next 1-3 days. If you use inhalers (even only as needed), please bring them with you on the day of your procedure.  Stop Carafate in 2 weeks.    Thank you,  Dr. Jackquline Denmark

## 2019-12-16 ENCOUNTER — Other Ambulatory Visit: Payer: Self-pay | Admitting: Gastroenterology

## 2019-12-16 ENCOUNTER — Ambulatory Visit (HOSPITAL_COMMUNITY)
Admission: RE | Admit: 2019-12-16 | Discharge: 2019-12-16 | Disposition: A | Discharge status: Home / Self Care | Payer: Medicare PPO | Source: Ambulatory Visit | Attending: Gastroenterology | Admitting: Gastroenterology

## 2019-12-16 DIAGNOSIS — R634 Abnormal weight loss: Secondary | ICD-10-CM

## 2019-12-16 DIAGNOSIS — R1013 Epigastric pain: Secondary | ICD-10-CM

## 2019-12-16 DIAGNOSIS — K219 Gastro-esophageal reflux disease without esophagitis: Secondary | ICD-10-CM

## 2019-12-16 DIAGNOSIS — R112 Nausea with vomiting, unspecified: Secondary | ICD-10-CM | POA: Diagnosis not present

## 2019-12-16 DIAGNOSIS — R9389 Abnormal findings on diagnostic imaging of other specified body structures: Secondary | ICD-10-CM | POA: Diagnosis not present

## 2019-12-16 DIAGNOSIS — R748 Abnormal levels of other serum enzymes: Secondary | ICD-10-CM

## 2019-12-16 MED ORDER — GADOBUTROL 1 MMOL/ML IV SOLN
7.5000 mL | Freq: Once | INTRAVENOUS | Status: AC | PRN
  Administered 2019-12-16: 7.5 mL via INTRAVENOUS

## 2019-12-17 ENCOUNTER — Encounter: Payer: Self-pay | Admitting: Gastroenterology

## 2019-12-23 NOTE — Telephone Encounter (Signed)
Spoke to patient to inform her of MRI results.All questions answered. Patient voiced understanding

## 2019-12-23 NOTE — Telephone Encounter (Signed)
Patient requesting results of MRI.

## 2020-01-21 ENCOUNTER — Encounter: Payer: Self-pay | Admitting: Gastroenterology

## 2020-01-29 ENCOUNTER — Ambulatory Visit: Payer: Medicare PPO | Admitting: Gastroenterology

## 2020-02-04 ENCOUNTER — Other Ambulatory Visit: Payer: Self-pay

## 2020-02-04 ENCOUNTER — Ambulatory Visit (AMBULATORY_SURGERY_CENTER): Payer: Medicare PPO | Admitting: Gastroenterology

## 2020-02-04 ENCOUNTER — Encounter: Payer: Self-pay | Admitting: Gastroenterology

## 2020-02-04 VITALS — BP 108/58 | HR 63 | Temp 96.9°F | Resp 14 | Ht 64.0 in | Wt 172.0 lb

## 2020-02-04 DIAGNOSIS — Z1211 Encounter for screening for malignant neoplasm of colon: Secondary | ICD-10-CM | POA: Diagnosis not present

## 2020-02-04 DIAGNOSIS — R9389 Abnormal findings on diagnostic imaging of other specified body structures: Secondary | ICD-10-CM

## 2020-02-04 DIAGNOSIS — K219 Gastro-esophageal reflux disease without esophagitis: Secondary | ICD-10-CM | POA: Diagnosis not present

## 2020-02-04 DIAGNOSIS — Z800 Family history of malignant neoplasm of digestive organs: Secondary | ICD-10-CM

## 2020-02-04 DIAGNOSIS — K2950 Unspecified chronic gastritis without bleeding: Secondary | ICD-10-CM | POA: Diagnosis not present

## 2020-02-04 DIAGNOSIS — K2970 Gastritis, unspecified, without bleeding: Secondary | ICD-10-CM

## 2020-02-04 DIAGNOSIS — K319 Disease of stomach and duodenum, unspecified: Secondary | ICD-10-CM | POA: Diagnosis not present

## 2020-02-04 MED ORDER — SODIUM CHLORIDE 0.9 % IV SOLN: 500.0000 mL | Freq: Once | INTRAVENOUS | Status: AC

## 2020-02-04 MED ORDER — PANTOPRAZOLE SODIUM 40 MG PO TBEC: 40.0000 mg | DELAYED_RELEASE_TABLET | Freq: Every day | ORAL | 3 refills | Status: AC

## 2020-02-04 NOTE — Op Note (Signed)
[facility] Patient Name: Leslie Woods Procedure Date: 02/04/2020 9:00 AM MRN: [id number] Endoscopist: Jackquline Denmark , MD Age: 64 Referring MD:  Date of Birth: Nov 04, 1955 Gender: Female Account #: [id number] Procedure:                Upper GI endoscopy Indications:              GERD. Medicines:                Monitored Anesthesia Care Procedure:                Pre-Anesthesia Assessment:                           - Prior to the procedure, a History and Physical                            was performed, and patient medications and                            allergies were reviewed. The patient's tolerance of                            previous anesthesia was also reviewed. The risks                            and benefits of the procedure and the sedation                            options and risks were discussed with the patient.                            All questions were answered, and informed consent                            was obtained. Prior Anticoagulants: The patient has                            taken no previous anticoagulant or antiplatelet                            agents. ASA Grade Assessment: II - A patient with                            mild systemic disease. After reviewing the risks                            and benefits, the patient was deemed in                            satisfactory condition to undergo the procedure.                           After obtaining informed consent, the endoscope was  passed under direct vision. Throughout the                            procedure, the patient's blood pressure, pulse, and                            oxygen saturations were monitored continuously. The                            Endoscope was introduced through the mouth, and                            advanced to the second part of duodenum. The upper                            GI endoscopy was accomplished without difficulty.                             The patient tolerated the procedure well. Scope In: Scope Out: Findings:                 The examined esophagus was normal without any                            strictures or abnormalities. Hence, we decided to                            hold off on esophageal dilatation.                           The Z-line was regular and was found 36 cm from the                            incisors, examined by NBI.                           Localized minimal inflammation characterized by                            erythema was found in the gastric antrum. Biopsies                            were taken with a cold forceps for histology.                           The examined duodenum was normal. Biopsies for                            histology were taken with a cold forceps for                            evaluation of celiac disease. Complications:            No immediate complications. Estimated Blood Loss:     Estimated blood loss: none. Impression:               -  Minimal gastritis. Recommendation:           - Patient has a contact number available for                            emergencies. The signs and symptoms of potential                            delayed complications were discussed with the                            patient. Return to normal activities tomorrow.                            Written discharge instructions were provided to the                            patient.                           - Resume previous diet.                           - Continue Protonix 40 mg p.o. once a day for now.                           - Await pathology results.                           - The findings and recommendations were discussed                            with the patient's family. Jackquline Denmark, MD 02/04/2020 9:32:55 AM This report has been signed electronically.

## 2020-02-04 NOTE — Patient Instructions (Addendum)
YOU HAD AN ENDOSCOPIC PROCEDURE TODAY AT [facility]:   Refer to the procedure report that was given to you for any specific questions about what was found during the examination.  If the procedure report does not answer your questions, please call your gastroenterologist to clarify.  If you requested that your care partner not be given the details of your procedure findings, then the procedure report has been included in a sealed envelope for you to review at your convenience later.  YOU SHOULD EXPECT: Some feelings of bloating in the abdomen. Passage of more gas than usual.  Walking can help get rid of the air that was put into your GI tract during the procedure and reduce the bloating. If you had a lower endoscopy (such as a colonoscopy or flexible sigmoidoscopy) you may notice spotting of blood in your stool or on the toilet paper. If you underwent a bowel prep for your procedure, you may not have a normal bowel movement for a few days.  Please Note:  You might notice some irritation and congestion in your nose or some drainage.  This is from the oxygen used during your procedure.  There is no need for concern and it should clear up in a day or so.  SYMPTOMS TO REPORT IMMEDIATELY:   Following lower endoscopy (colonoscopy or flexible sigmoidoscopy):  Excessive amounts of blood in the stool  Significant tenderness or worsening of abdominal pains  Swelling of the abdomen that is new, acute  Fever of 100F or higher   Following upper endoscopy (EGD)  Vomiting of blood or coffee ground material  New chest pain or pain under the shoulder blades  Painful or persistently difficult swallowing  New shortness of breath  Fever of 100F or higher  Black, tarry-looking stools  For urgent or emergent issues, a gastroenterologist can be reached at any hour by calling [phone]. Do not use MyChart messaging for urgent concerns.    DIET:  We do recommend a small meal at first, but  then you may proceed to your regular diet.  Drink plenty of fluids but you should avoid alcoholic beverages for 24 hours.  MEDICATIONS: Continue Protonix 40 mg by mouth once a day for now.  Please see handouts given to you by your recovery nurse.  Follow up with Dr. Lyndel Safe in his office as needed.  ACTIVITY:  You should plan to take it easy for the rest of today and you should NOT DRIVE or use heavy machinery until tomorrow (because of the sedation medicines used during the test).    FOLLOW UP: Our staff will call the number listed on your records 48-72 hours following your procedure to check on you and address any questions or concerns that you may have regarding the information given to you following your procedure. If we do not reach you, we will leave a message.  We will attempt to reach you two times.  During this call, we will ask if you have developed any symptoms of COVID 19. If you develop any symptoms (ie: fever, flu-like symptoms, shortness of breath, cough etc.) before then, please call [phone].  If you test positive for Covid 19 in the 2 weeks post procedure, please call and report this information to [place].    If any biopsies were taken you will be contacted by phone or by letter within the next 1-3 weeks.  Please call us at [phone] if you have not heard about the biopsies in 3  weeks.   Thank you for allowing [place] to provide for your healthcare needs today.   SIGNATURES/CONFIDENTIALITY: You and/or your care partner have signed paperwork which will be entered into your electronic medical record.  These signatures attest to the fact that that the information above on your After Visit Summary has been reviewed and is understood.  Full responsibility of the confidentiality of this discharge information lies with you and/or your care-partner.

## 2020-02-04 NOTE — Op Note (Signed)
[facility] Patient Name: Leslie Woods Procedure Date: 02/04/2020 8:59 AM MRN: [id number] Endoscopist: Jackquline Denmark , MD Age: 64 Referring MD:  Date of Birth: 08/02/1955 Gender: Female Account #: [id number] Procedure:                Colonoscopy Indications:              Colon cancer screening in patient at increased                            risk: Colorectal cancer in mother Medicines:                Monitored Anesthesia Care Procedure:                Pre-Anesthesia Assessment:                           - Prior to the procedure, a History and Physical                            was performed, and patient medications and                            allergies were reviewed. The patient's tolerance of                            previous anesthesia was also reviewed. The risks                            and benefits of the procedure and the sedation                            options and risks were discussed with the patient.                            All questions were answered, and informed consent                            was obtained. Prior Anticoagulants: The patient has                            taken no previous anticoagulant or antiplatelet                            agents. ASA Grade Assessment: II - A patient with                            mild systemic disease. After reviewing the risks                            and benefits, the patient was deemed in                            satisfactory condition to undergo the procedure.  After obtaining informed consent, the colonoscope                            was passed under direct vision. Throughout the                            procedure, the patient's blood pressure, pulse, and                            oxygen saturations were monitored continuously. The                            Colonoscope was introduced through the anus and                            advanced to the 2 cm into the  ileum. The                            colonoscopy was performed without difficulty. The                            patient tolerated the procedure well. The quality                            of the bowel preparation was good. The terminal                            ileum, ileocecal valve, appendiceal orifice, and                            rectum were photographed. Scope In: 9:16:13 AM Scope Out: 9:27:17 AM Scope Withdrawal Time: 0 hours 7 minutes 38 seconds  Total Procedure Duration: 0 hours 11 minutes 4 seconds  Findings:                 The colon (entire examined portion) appeared normal.                           A few rare small-mouthed diverticula were found in                            the sigmoid colon and ascending colon.                           Non-bleeding internal hemorrhoids were found during                            retroflexion. The hemorrhoids were small.                           The terminal ileum appeared normal.                           The exam was otherwise without abnormality on  direct and retroflexion views. Complications:            No immediate complications. Estimated Blood Loss:     Estimated blood loss: none. Impression:               - Very minimal colonic diverticulosis.                           - Otherwise normal colonoscopy to TI. Recommendation:           - Patient has a contact number available for                            emergencies. The signs and symptoms of potential                            delayed complications were discussed with the                            patient. Return to normal activities tomorrow.                            Written discharge instructions were provided to the                            patient.                           - Resume previous diet.                           - Continue present medications.                           - Repeat colonoscopy in 5 years for screening                             purposes. Earlier, if with any new problems or if                            there is any change in family history.                           - Return to GI clinic PRN.                           - The findings and recommendations were discussed                            with the patient's family. Jackquline Denmark, MD 02/04/2020 9:36:16 AM This report has been signed electronically.

## 2020-02-04 NOTE — Progress Notes (Signed)
A and O x3. Report to RN. Tolerated MAC anesthesia well.Teeth unchanged after procedure. 

## 2020-02-04 NOTE — Progress Notes (Signed)
Called to room to assist during endoscopic procedure.  Patient ID and intended procedure confirmed with present staff. Received instructions for my participation in the procedure from the performing physician.  

## 2020-02-08 ENCOUNTER — Telehealth: Payer: Self-pay

## 2020-02-08 NOTE — Telephone Encounter (Signed)
°  Follow up Call-  Call back number 02/04/2020  Post procedure Call Back phone  # [phone]  Permission to leave phone message No  Some recent data might be hidden     Patient questions:  Do you have a fever, pain , or abdominal swelling? No. Pain Score  0 *  Have you tolerated food without any problems? Yes.    Have you been able to return to your normal activities? Yes.    Do you have any questions about your discharge instructions: Diet   No. Medications  No. Follow up visit  No.  Do you have questions or concerns about your Care? No.  Actions: * If pain score is 4 or above: No action needed, pain <4. 1. Have you developed a fever since your procedure? no  2.   Have you had an respiratory symptoms (SOB or cough) since your procedure? no  3.   Have you tested positive for COVID 19 since your procedure no  4.   Have you had any family members/close contacts diagnosed with the COVID 19 since your procedure?  no   If yes to any of these questions please route to Joylene John, RN and Joella Prince, RN

## 2020-02-13 ENCOUNTER — Encounter: Payer: Self-pay | Admitting: Gastroenterology

## 2020-03-31 DIAGNOSIS — E669 Obesity, unspecified: Secondary | ICD-10-CM | POA: Diagnosis not present

## 2020-03-31 DIAGNOSIS — Z2821 Immunization not carried out because of patient refusal: Secondary | ICD-10-CM | POA: Diagnosis not present

## 2020-03-31 DIAGNOSIS — R7303 Prediabetes: Secondary | ICD-10-CM | POA: Diagnosis not present

## 2020-03-31 DIAGNOSIS — Z6830 Body mass index (BMI) 30.0-30.9, adult: Secondary | ICD-10-CM | POA: Diagnosis not present

## 2020-03-31 DIAGNOSIS — I700 Atherosclerosis of aorta: Secondary | ICD-10-CM | POA: Diagnosis not present

## 2020-03-31 DIAGNOSIS — I10 Essential (primary) hypertension: Secondary | ICD-10-CM | POA: Diagnosis not present

## 2020-03-31 DIAGNOSIS — E7800 Pure hypercholesterolemia, unspecified: Secondary | ICD-10-CM | POA: Diagnosis not present

## 2020-04-19 DIAGNOSIS — U071 COVID-19: Secondary | ICD-10-CM | POA: Diagnosis not present

## 2020-09-09 DIAGNOSIS — Z1152 Encounter for screening for COVID-19: Secondary | ICD-10-CM | POA: Diagnosis not present

## 2020-09-09 DIAGNOSIS — J069 Acute upper respiratory infection, unspecified: Secondary | ICD-10-CM | POA: Diagnosis not present

## 2020-09-10 DIAGNOSIS — Z1152 Encounter for screening for COVID-19: Secondary | ICD-10-CM | POA: Diagnosis not present

## 2020-09-10 DIAGNOSIS — J069 Acute upper respiratory infection, unspecified: Secondary | ICD-10-CM | POA: Diagnosis not present

## 2020-10-06 DIAGNOSIS — E7800 Pure hypercholesterolemia, unspecified: Secondary | ICD-10-CM | POA: Diagnosis not present

## 2020-10-06 DIAGNOSIS — Z0000 Encounter for general adult medical examination without abnormal findings: Secondary | ICD-10-CM | POA: Diagnosis not present

## 2020-10-06 DIAGNOSIS — E669 Obesity, unspecified: Secondary | ICD-10-CM | POA: Diagnosis not present

## 2020-10-06 DIAGNOSIS — I700 Atherosclerosis of aorta: Secondary | ICD-10-CM | POA: Diagnosis not present

## 2020-10-06 DIAGNOSIS — K2100 Gastro-esophageal reflux disease with esophagitis, without bleeding: Secondary | ICD-10-CM | POA: Diagnosis not present

## 2020-10-06 DIAGNOSIS — R7301 Impaired fasting glucose: Secondary | ICD-10-CM | POA: Diagnosis not present

## 2020-10-06 DIAGNOSIS — I10 Essential (primary) hypertension: Secondary | ICD-10-CM | POA: Diagnosis not present

## 2020-10-06 DIAGNOSIS — Z6830 Body mass index (BMI) 30.0-30.9, adult: Secondary | ICD-10-CM | POA: Diagnosis not present

## 2020-10-06 DIAGNOSIS — F329 Major depressive disorder, single episode, unspecified: Secondary | ICD-10-CM | POA: Diagnosis not present

## 2020-12-27 DIAGNOSIS — Z1231 Encounter for screening mammogram for malignant neoplasm of breast: Secondary | ICD-10-CM | POA: Diagnosis not present

## 2021-02-02 ENCOUNTER — Ambulatory Visit: Payer: Medicare PPO | Admitting: Podiatry

## 2021-02-02 ENCOUNTER — Encounter: Payer: Self-pay | Admitting: Podiatry

## 2021-02-02 ENCOUNTER — Other Ambulatory Visit: Payer: Self-pay

## 2021-02-02 DIAGNOSIS — G588 Other specified mononeuropathies: Secondary | ICD-10-CM | POA: Diagnosis not present

## 2021-02-09 NOTE — Progress Notes (Signed)
  Subjective:  Patient ID: Leslie Woods, female    DOB: Mar 30, 1956,  MRN: [id number]  Chief Complaint  Patient presents with   Foot Pain    I have some pain on the second toes and Dr Burnett Sheng gave me some gabapentin and really don't feel like it is helping    65 y.o. female presents with the above complaint. History confirmed with patient.   Objective:  Physical Exam: warm, good capillary refill, no trophic changes or ulcerative lesions, normal DP and PT pulses, and normal sensory exam. POP 2nd interspace bilat with Mulder's click    Assessment:   1. Interdigital neuroma    Plan:  Patient was evaluated and treated and all questions answered.  Interdigital Neuroma -Educated on etiology -Educated on padding and proper shoegear -Injection delivered to the affected interspaces -Will get XR next visit to evaluate the metatarsals for decreased IMA  Procedure: Neuroma Injection Location: Bilateral 2nd interspace Skin Prep: Alcohol. Injectate: 0.5 cc 0.5% marcaine plain, 0.5 cc betamethasone acetate-betamethasone sodium phosphate Disposition: Patient tolerated procedure well. Injection site dressed with a band-aid.  Return in about 1 month (around 03/05/2021) for Neuroma, with XRs.

## 2021-03-02 ENCOUNTER — Ambulatory Visit (INDEPENDENT_AMBULATORY_CARE_PROVIDER_SITE_OTHER): Payer: Medicare PPO

## 2021-03-02 ENCOUNTER — Encounter: Payer: Self-pay | Admitting: Podiatry

## 2021-03-02 ENCOUNTER — Ambulatory Visit: Payer: Medicare PPO | Admitting: Podiatry

## 2021-03-02 DIAGNOSIS — G5762 Lesion of plantar nerve, left lower limb: Secondary | ICD-10-CM | POA: Diagnosis not present

## 2021-03-02 DIAGNOSIS — G5761 Lesion of plantar nerve, right lower limb: Secondary | ICD-10-CM

## 2021-03-02 DIAGNOSIS — G5763 Lesion of plantar nerve, bilateral lower limbs: Secondary | ICD-10-CM

## 2021-03-02 DIAGNOSIS — D3610 Benign neoplasm of peripheral nerves and autonomic nervous system, unspecified: Secondary | ICD-10-CM

## 2021-03-02 MED ORDER — BETAMETHASONE SOD PHOS & ACET 6 (3-3) MG/ML IJ SUSP
6.0000 mg | Freq: Once | INTRAMUSCULAR | Status: AC
  Administered 2021-03-02: 13:00:00 6 mg

## 2021-03-02 NOTE — Progress Notes (Signed)
  Subjective:  Patient ID: Leslie Woods, female    DOB: Oct 20, 1955,  MRN: [id number]  Chief Complaint  Patient presents with   Foot Pain    I am a little better and the 2nd toes on both feet are numb and the shots did help    65 y.o. female presents with the above complaint. History confirmed with patient. States the injections helped and she felt less burning in the ball of the feet.  Objective:  Physical Exam: warm, good capillary refill, no trophic changes or ulcerative lesions, normal DP and PT pulses, and normal sensory exam. POP 2nd interspace bilat with Mulder's click    Radiographs: taken and reviewed 3v both feet no fractures or dislocations no degenerative changes noted Assessment:   1. Neuroma    Plan:  Patient was evaluated and treated and all questions answered.  Interdigital Neuroma -Repeat injection delivered -XR reviewed. She does appear to have some decreased 2nd/3rd IMA bilat  Procedure: Neuroma Injection Location: Bilateral 2nd interspace Skin Prep: Alcohol. Injectate: 0.5 cc 0.5% marcaine plain, 0.5 cc betamethasone acetate-betamethasone sodium phosphate Disposition: Patient tolerated procedure well. Injection site dressed with a band-aid.  No follow-ups on file.

## 2021-03-30 ENCOUNTER — Ambulatory Visit: Payer: Medicare PPO | Admitting: Podiatry

## 2021-03-30 ENCOUNTER — Encounter: Payer: Self-pay | Admitting: Podiatry

## 2021-03-30 DIAGNOSIS — D3610 Benign neoplasm of peripheral nerves and autonomic nervous system, unspecified: Secondary | ICD-10-CM

## 2021-03-30 DIAGNOSIS — G588 Other specified mononeuropathies: Secondary | ICD-10-CM | POA: Diagnosis not present

## 2021-03-30 DIAGNOSIS — G5761 Lesion of plantar nerve, right lower limb: Secondary | ICD-10-CM | POA: Diagnosis not present

## 2021-03-30 DIAGNOSIS — G5762 Lesion of plantar nerve, left lower limb: Secondary | ICD-10-CM | POA: Diagnosis not present

## 2021-03-30 MED ORDER — BETAMETHASONE SOD PHOS & ACET 6 (3-3) MG/ML IJ SUSP
6.0000 mg | Freq: Once | INTRAMUSCULAR | Status: AC
  Administered 2021-03-30: 6 mg

## 2021-03-30 NOTE — Progress Notes (Signed)
°  Subjective:  Patient ID: Delania Ferg, female    DOB: 08-14-1955,  MRN: [id number]  Chief Complaint  Patient presents with   Neuroma    The burning is not there but the numbness is and the injections did help and what would be the next step-surgery?   65 y.o. female presents with the above complaint. History confirmed with patient.  Objective:  Physical Exam: warm, good capillary refill, no trophic changes or ulcerative lesions, normal DP and PT pulses, and normal sensory exam. POP 2nd interspace bilat with Mulder's click   Assessment:   1. Neuroma   2. Interdigital neuroma    Plan:  Patient was evaluated and treated and all questions answered.  Interdigital Neuroma -Injection #3 delivered -Discussed should issues persist consider sclerosing injection or excision. Discussed r/b of both.  Procedure: Neuroma Injection Location: Bilateral 3rd interspace Skin Prep: Alcohol. Injectate: 0.5 cc 0.5% marcaine plain, 0.5 cc betamethasone acetate-betamethasone sodium phosphate Disposition: Patient tolerated procedure well. Injection site dressed with a band-aid.   No follow-ups on file.

## 2021-04-06 DIAGNOSIS — R7303 Prediabetes: Secondary | ICD-10-CM | POA: Diagnosis not present

## 2021-04-06 DIAGNOSIS — K219 Gastro-esophageal reflux disease without esophagitis: Secondary | ICD-10-CM | POA: Diagnosis not present

## 2021-04-06 DIAGNOSIS — I10 Essential (primary) hypertension: Secondary | ICD-10-CM | POA: Diagnosis not present

## 2021-04-06 DIAGNOSIS — E669 Obesity, unspecified: Secondary | ICD-10-CM | POA: Diagnosis not present

## 2021-04-06 DIAGNOSIS — E7800 Pure hypercholesterolemia, unspecified: Secondary | ICD-10-CM | POA: Diagnosis not present

## 2021-04-06 DIAGNOSIS — I700 Atherosclerosis of aorta: Secondary | ICD-10-CM | POA: Diagnosis not present

## 2021-04-06 DIAGNOSIS — Z6831 Body mass index (BMI) 31.0-31.9, adult: Secondary | ICD-10-CM | POA: Diagnosis not present

## 2021-05-04 ENCOUNTER — Encounter: Payer: Self-pay | Admitting: Podiatry

## 2021-05-04 ENCOUNTER — Other Ambulatory Visit: Payer: Self-pay

## 2021-05-04 ENCOUNTER — Ambulatory Visit: Payer: Medicare PPO | Admitting: Podiatry

## 2021-05-04 DIAGNOSIS — G588 Other specified mononeuropathies: Secondary | ICD-10-CM | POA: Diagnosis not present

## 2021-05-04 DIAGNOSIS — D3610 Benign neoplasm of peripheral nerves and autonomic nervous system, unspecified: Secondary | ICD-10-CM | POA: Diagnosis not present

## 2021-05-11 NOTE — Progress Notes (Signed)
°  Subjective:  Patient ID: Leslie Woods, female    DOB: Dec 03, 1955,  MRN: [id number]  Chief Complaint  Patient presents with   Neuroma    The shots really helped and I have already had 3 shots and if I had another shot I think it would help    66 y.o. female presents with the above complaint. History confirmed with patient.  Objective:  Physical Exam: warm, good capillary refill, no trophic changes or ulcerative lesions, normal DP and PT pulses, and normal sensory exam. POP 2nd interspace bilat with Mulder's click   Assessment:   1. Neuroma   2. Interdigital neuroma    Plan:  Patient was evaluated and treated and all questions answered.  Interdigital Neuroma -Will consider excision if pain persists. Could consider sclerosing injection next visit.   Return in about 6 weeks (around 06/15/2021) for Neuroma.

## 2021-06-15 ENCOUNTER — Ambulatory Visit: Payer: Medicare PPO | Admitting: Podiatry

## 2021-06-19 ENCOUNTER — Other Ambulatory Visit: Payer: Self-pay

## 2021-06-19 ENCOUNTER — Ambulatory Visit: Payer: Medicare PPO | Admitting: Podiatry

## 2021-06-19 ENCOUNTER — Encounter: Payer: Self-pay | Admitting: Podiatry

## 2021-06-19 DIAGNOSIS — D3610 Benign neoplasm of peripheral nerves and autonomic nervous system, unspecified: Secondary | ICD-10-CM | POA: Diagnosis not present

## 2021-06-19 DIAGNOSIS — M7752 Other enthesopathy of left foot: Secondary | ICD-10-CM | POA: Diagnosis not present

## 2021-06-19 DIAGNOSIS — M7751 Other enthesopathy of right foot: Secondary | ICD-10-CM

## 2021-06-19 NOTE — Progress Notes (Signed)
?  Subjective:  ?Patient ID: Leslie Woods, female    DOB: Mar 06, 1956,  MRN: [id number] ? ?Chief Complaint  ?Patient presents with  ? Neuroma  ?  The injection made the toes burn and still feels numb and I wonder can it ever be normal with out having surgery  ? ?66 y.o. female presents with the above complaint. Patient doing better. She does relates some continued numbness in the toes. Relates she was never able to do any padding. Has questions about surgery.  ? ?Objective:  ?Physical Exam: ?warm, good capillary refill, no trophic changes or ulcerative lesions, normal DP and PT pulses, and normal sensory exam. POP 2nd interspace bilat tender to plantar second MTPJ and pain with Rom of the joint on the right.  ?Assessment:  ? ?1. Neuroma   ?2. Capsulitis of toe, right   ?3. Capsulitis of toe, left   ? ? ?Plan:  ?Patient was evaluated and treated and all questions answered. ?Discussed neuroma and treatment options with patient.  ?Radiographs reviewed and discussed with patient.  ?Injection offered today. Patient in agreement. Procedure below.  ?Discussed padding and offloading today.  ?Unable to take NSAIDs.  ?Discussed if pain does not improve may consider  MRI for further surgical planning.  ?Patient to return in 6 weeks or sooner if concerns arise.  ? ?Procedure: Injection Tendon/Ligament ?Discussed alternatives, risks, complications and verbal consent was obtained.  ?Location: Bilateral second MPJ. ?Skin Prep: Alcohol. ?Injectate: 1cc 0.5% marcaine plain, 1 cc dexamethasone.  ?Disposition: Patient tolerated procedure well. Injection site dressed with a band-aid.  ?Post-injection care was discussed and return precautions discussed.  ? ? ? ? ? ?Return in about 6 weeks (around 07/31/2021).  ? ? ?

## 2021-07-31 ENCOUNTER — Ambulatory Visit: Payer: Medicare PPO | Admitting: Podiatry

## 2021-07-31 ENCOUNTER — Encounter: Payer: Self-pay | Admitting: Podiatry

## 2021-07-31 DIAGNOSIS — M7751 Other enthesopathy of right foot: Secondary | ICD-10-CM

## 2021-07-31 DIAGNOSIS — D3610 Benign neoplasm of peripheral nerves and autonomic nervous system, unspecified: Secondary | ICD-10-CM

## 2021-07-31 DIAGNOSIS — M7752 Other enthesopathy of left foot: Secondary | ICD-10-CM

## 2021-07-31 NOTE — Progress Notes (Signed)
?  Subjective:  ?Patient ID: Leslie Woods, female    DOB: 04-Jul-1955,  MRN: [id number] ? ?Chief Complaint  ?Patient presents with  ? Foot Pain  ?  About the same and the shots did help for about 6 weeks and does not burn as bad as before and hurts more when I am off of them  ? ?66 y.o. female presents for follow-up of bilateral neuromas/capsulitis. Patient doing better. She does relates some continued numbness in the toes. Relates she was never able to do any padding.  ?Objective:  ?Physical Exam: ?warm, good capillary refill, no trophic changes or ulcerative lesions, normal DP and PT pulses, and normal sensory exam. POP 2nd interspace bilat tender to plantar second MTPJ and pain with Rom of the joint on the right.  ?Assessment:  ? ?1. Neuroma   ?2. Capsulitis of toe, right   ?3. Capsulitis of toe, left   ? ? ? ?Plan:  ?Patient was evaluated and treated and all questions answered. ?Discussed neuroma and treatment options with patient.  ?Radiographs reviewed and discussed with patient.  ?Discussed padding and offloading today.  ?Unable to take NSAIDs.  ?Take tylenol as needed.  ?Discussed getting her back evaluated as some of this pain may be coming from there and would be helpful to sort some of it out.  ?No injection today as she is doing well currently.  ?Discussed if pain does not improve may consider  MRI for further surgical planning.  ?Patient to return as needed.  ? ? ? ? ? ? ? ?No follow-ups on file.  ? ? ?

## 2021-09-19 DIAGNOSIS — M545 Low back pain, unspecified: Secondary | ICD-10-CM | POA: Diagnosis not present

## 2021-10-05 DIAGNOSIS — Z0000 Encounter for general adult medical examination without abnormal findings: Secondary | ICD-10-CM | POA: Diagnosis not present

## 2021-10-05 DIAGNOSIS — F329 Major depressive disorder, single episode, unspecified: Secondary | ICD-10-CM | POA: Diagnosis not present

## 2021-10-05 DIAGNOSIS — R7301 Impaired fasting glucose: Secondary | ICD-10-CM | POA: Diagnosis not present

## 2021-10-05 DIAGNOSIS — E7800 Pure hypercholesterolemia, unspecified: Secondary | ICD-10-CM | POA: Diagnosis not present

## 2021-10-05 DIAGNOSIS — K2100 Gastro-esophageal reflux disease with esophagitis, without bleeding: Secondary | ICD-10-CM | POA: Diagnosis not present

## 2021-10-05 DIAGNOSIS — I10 Essential (primary) hypertension: Secondary | ICD-10-CM | POA: Diagnosis not present

## 2021-10-05 DIAGNOSIS — I700 Atherosclerosis of aorta: Secondary | ICD-10-CM | POA: Diagnosis not present

## 2021-10-12 DIAGNOSIS — M545 Low back pain, unspecified: Secondary | ICD-10-CM | POA: Diagnosis not present

## 2021-10-19 DIAGNOSIS — M79672 Pain in left foot: Secondary | ICD-10-CM | POA: Diagnosis not present

## 2021-10-19 DIAGNOSIS — M069 Rheumatoid arthritis, unspecified: Secondary | ICD-10-CM | POA: Diagnosis not present

## 2021-10-19 DIAGNOSIS — M778 Other enthesopathies, not elsewhere classified: Secondary | ICD-10-CM | POA: Diagnosis not present

## 2021-10-19 DIAGNOSIS — G5780 Other specified mononeuropathies of unspecified lower limb: Secondary | ICD-10-CM | POA: Diagnosis not present

## 2021-10-19 DIAGNOSIS — M79671 Pain in right foot: Secondary | ICD-10-CM | POA: Diagnosis not present

## 2021-10-19 DIAGNOSIS — M459 Ankylosing spondylitis of unspecified sites in spine: Secondary | ICD-10-CM | POA: Diagnosis not present

## 2021-10-26 ENCOUNTER — Ambulatory Visit: Payer: Medicare PPO | Admitting: Podiatry

## 2021-11-02 DIAGNOSIS — R200 Anesthesia of skin: Secondary | ICD-10-CM | POA: Diagnosis not present

## 2021-11-02 DIAGNOSIS — M79671 Pain in right foot: Secondary | ICD-10-CM | POA: Diagnosis not present

## 2021-11-02 DIAGNOSIS — M19071 Primary osteoarthritis, right ankle and foot: Secondary | ICD-10-CM | POA: Diagnosis not present

## 2021-11-06 DIAGNOSIS — M79672 Pain in left foot: Secondary | ICD-10-CM | POA: Diagnosis not present

## 2021-11-19 ENCOUNTER — Other Ambulatory Visit: Payer: Self-pay

## 2021-11-19 ENCOUNTER — Emergency Department
Admission: EM | Admit: 2021-11-19 | Discharge: 2021-11-19 | Disposition: A | Discharge status: Home / Self Care | Payer: Medicare PPO | Attending: Emergency Medicine | Admitting: Emergency Medicine

## 2021-11-19 DIAGNOSIS — R110 Nausea: Secondary | ICD-10-CM | POA: Diagnosis not present

## 2021-11-19 DIAGNOSIS — R258 Other abnormal involuntary movements: Secondary | ICD-10-CM | POA: Diagnosis not present

## 2021-11-19 DIAGNOSIS — N179 Acute kidney failure, unspecified: Secondary | ICD-10-CM | POA: Diagnosis not present

## 2021-11-19 DIAGNOSIS — I10 Essential (primary) hypertension: Secondary | ICD-10-CM | POA: Diagnosis not present

## 2021-11-19 DIAGNOSIS — R5383 Other fatigue: Secondary | ICD-10-CM | POA: Insufficient documentation

## 2021-11-19 DIAGNOSIS — G4700 Insomnia, unspecified: Secondary | ICD-10-CM | POA: Diagnosis not present

## 2021-11-19 DIAGNOSIS — D72829 Elevated white blood cell count, unspecified: Secondary | ICD-10-CM | POA: Diagnosis not present

## 2021-11-19 DIAGNOSIS — Z85820 Personal history of malignant melanoma of skin: Secondary | ICD-10-CM | POA: Diagnosis not present

## 2021-11-19 LAB — COMPREHENSIVE METABOLIC PANEL
ALT: 31 U/L (ref 0–44)
AST: 29 U/L (ref 15–41)
Albumin: 4.4 g/dL (ref 3.5–5.0)
Alkaline Phosphatase: 80 U/L (ref 38–126)
Anion gap: 12 (ref 5–15)
BUN: 28 mg/dL — ABNORMAL HIGH (ref 8–23)
CO2: 24 mmol/L (ref 22–32)
Calcium: 9.7 mg/dL (ref 8.9–10.3)
Chloride: 103 mmol/L (ref 98–111)
Creatinine, Ser: 1.62 mg/dL — ABNORMAL HIGH (ref 0.44–1.00)
GFR, Estimated: 34 mL/min — ABNORMAL LOW (ref >60)
Glucose, Bld: 152 mg/dL — ABNORMAL HIGH (ref 70–99)
Potassium: 3.9 mmol/L (ref 3.5–5.1)
Sodium: 139 mmol/L (ref 135–145)
Total Bilirubin: 0.9 mg/dL (ref 0.3–1.2)
Total Protein: 7.7 g/dL (ref 6.5–8.1)

## 2021-11-19 LAB — CBC
HCT: 41 % (ref 36.0–46.0)
Hemoglobin: 13.2 g/dL (ref 12.0–15.0)
MCH: 30.6 pg (ref 26.0–34.0)
MCHC: 32.2 g/dL (ref 30.0–36.0)
MCV: 95.1 fL (ref 80.0–100.0)
Platelets: 302 10*3/uL (ref 150–400)
RBC: 4.31 MIL/uL (ref 3.87–5.11)
RDW: 14.5 % (ref 11.5–15.5)
WBC: 11.9 10*3/uL — ABNORMAL HIGH (ref 4.0–10.5)
nRBC: 0 % (ref 0.0–0.2)

## 2021-11-19 LAB — LIPASE, BLOOD: Lipase: 58 U/L — ABNORMAL HIGH (ref 11–51)

## 2021-11-19 MED ORDER — ONDANSETRON 4 MG PO TBDP
4.0000 mg | ORAL_TABLET | Freq: Once | ORAL | Status: AC | PRN
  Administered 2021-11-19: 4 mg via ORAL
  Filled 2021-11-19: qty 1

## 2021-11-19 MED ORDER — ONDANSETRON 4 MG PO TBDP: 4.0000 mg | ORAL_TABLET | Freq: Three times a day (TID) | ORAL | 0 refills | Status: AC | PRN

## 2021-11-19 NOTE — ED Triage Notes (Signed)
Pt presents to ER with c/o nausea x1 week.  Pt states she has been taking gabapentin for years and stopped taking it 7/24 and started taking lyrica.  Pt states she has been feeling bad for a week and has not been eating much d/t her nausea, and states tonight she had some bad tremors that she was unable to handle at home.  Pt denies any vomiting.  Pt is A&O x4 at this time in NAD.

## 2021-11-19 NOTE — ED Provider Notes (Signed)
[facility] Provider Note    Event Date/Time   First MD Initiated Contact with Patient 11/19/21 0820     (approximate)   History   Nausea   HPI  Leslie Woods is a 66 y.o. female history of hypertension, hyperlipidemia, borderline diabetes presents with nausea.  Patient tells me that she was recently switched from gabapentin to pregabalin.  She took her last dose of the gabapentin on July 24.  Since that time she has had decreased appetite nausea feeling generally fatigued and shaky.  Has had insomnia.  She denies abdominal pain chest pain or shortness of breath.  She stopped taking the pregabalin as well because it was not helping.  She denies fevers chills headache.  She called her pharmacist who told her this is likely gabapentin withdrawal.   Past Medical History:  Diagnosis Date   Birth defect    Right shoulder, shorter than right and decreased ROM of shoulder   Borderline diabetes    Family history of colon cancer    Hepatitis 1977   presumed Hep A   Hyperlipidemia    Hypertension    Osteopenia    Skin cancer (melanoma) ([place])    resected by [facility] 2018    Patient Active Problem List   Diagnosis Date Noted   Metatarsalgia of both feet 06/10/2017   Paresthesia of both feet 06/10/2017     Physical Exam  Triage Vital Signs: ED Triage Vitals [11/19/21 0532]  Enc Vitals Group     BP (!) 101/51     Pulse Rate 81     Resp 20     Temp 98.3 F (36.8 C)     Temp Source Oral     SpO2 97 %     Weight 179 lb (81.2 kg)     Height '5\' 4"'$  (1.626 m)     Head Circumference      Peak Flow      Pain Score 0     Pain Loc      Pain Edu?      Excl. in [place]?     Most recent vital signs: Vitals:   11/19/21 0839 11/19/21 0845  BP: 115/73 113/70  Pulse:  72  Resp:  20  Temp:    SpO2:  100%     General: Awake, no distress.  CV:  Good peripheral perfusion.No edema  Resp:  Normal effort.  Abd:  No distention. nontender Neuro:             Awake,  Alert, Oriented x 3  Other:     ED Results / Procedures / Treatments  Labs (all labs ordered are listed, but only abnormal results are displayed) Labs Reviewed  LIPASE, BLOOD - Abnormal; Notable for the following components:      Result Value   Lipase 58 (*)    All other components within normal limits  COMPREHENSIVE METABOLIC PANEL - Abnormal; Notable for the following components:   Glucose, Bld 152 (*)    BUN 28 (*)    Creatinine, Ser 1.62 (*)    GFR, Estimated 34 (*)    All other components within normal limits  CBC - Abnormal; Notable for the following components:   WBC 11.9 (*)    All other components within normal limits  URINALYSIS, ROUTINE W REFLEX MICROSCOPIC  URINE DRUG SCREEN, QUALITATIVE ([facility] ONLY)     EKG  EKG interpretation performed by myself: NSR, nml axis, nml intervals, no acute ischemic changes  RADIOLOGY    PROCEDURES:  Critical Care performed: No  Procedures  The patient is on the cardiac monitor to evaluate for evidence of arrhythmia and/or significant heart rate changes.   MEDICATIONS ORDERED IN ED: Medications  ondansetron (ZOFRAN-ODT) disintegrating tablet 4 mg (4 mg Oral Given 11/19/21 0538)     IMPRESSION / MDM / ASSESSMENT AND PLAN / ED COURSE  I reviewed the triage vital signs and the nursing notes.                              Patient's presentation is most consistent with acute complicated illness / injury requiring diagnostic workup.  Differential diagnosis includes, but is not limited to, medication side effect, viral illness, dyspepsia, peptic ulcer disease, pancreatitis, biliary colic  The patient is a 34 female presenting with nausea and decreased p.o. intake since stopping gabapentin.  Almost 2 weeks ago patient stopped gabapentin switched to pregabalin.  Started to feel poorly after this with decreased appetite nausea fatigue insomnia shakiness.  She is still tolerating p.o. has no chest pain or abdominal pain fevers  or chills.  She was initially switched to Lyrica but this did not make her feel well either so has been off of all pregabalin or gabapentin for about 1 week.  Patient's vitals initially were notable for some borderline hypotension but this resolved without intervention.  She was given Zofran in the waiting room and feels improved from a nausea standpoint on my evaluation.  She looks well is neurologically intact and has no abdominal tenderness.  Labs are notable for mild AKI creatinine 1.6 from baseline of around 1.3, BUN mildly increased.  She has a mild leukocytosis, LFTs and lipase.  EKG is unremarkable overall I suspect gabapentin withdrawal.  Patient does not want to be put back on gabapentin because it is not getting much relief and so I recommended that she stay the course and will prescribe Zofran for nausea.  She should be almost through any withdrawal at this point.  I advised primary care follow-up.  I concern for other life-threatening process such as acute abdomen or ACS is low given overall appearance and lack of other symptoms.  Patient tolerating p.o. in the ED, will encourage oral intake.  She is appropriate for discharge at this time.      FINAL CLINICAL IMPRESSION(S) / ED DIAGNOSES   Final diagnoses:  Nausea     Rx / DC Orders   ED Discharge Orders          Ordered    ondansetron (ZOFRAN-ODT) 4 MG disintegrating tablet  Every 8 hours PRN        11/19/21 0901             Note:  This document was prepared using Dragon voice recognition software and may include unintentional dictation errors.   Rada Hay, MD 11/19/21 [phone]

## 2021-11-19 NOTE — Discharge Instructions (Addendum)
I suspect that you are having withdrawal symptoms from stopping gabapentin.  You can take the Zofran as needed for nausea.  If you develop any new symptoms or worsening symptoms please either follow-up with your primary doctor or return to the emergency department.  Please try to at least be drinking fluids that contain electrolytes such as Gatorade or diluted juice.

## 2021-11-21 DIAGNOSIS — R110 Nausea: Secondary | ICD-10-CM | POA: Diagnosis not present

## 2021-12-15 DIAGNOSIS — M5451 Vertebrogenic low back pain: Secondary | ICD-10-CM | POA: Diagnosis not present

## 2021-12-15 DIAGNOSIS — M546 Pain in thoracic spine: Secondary | ICD-10-CM | POA: Diagnosis not present

## 2022-01-01 DIAGNOSIS — M4317 Spondylolisthesis, lumbosacral region: Secondary | ICD-10-CM | POA: Diagnosis not present

## 2022-01-01 DIAGNOSIS — M4316 Spondylolisthesis, lumbar region: Secondary | ICD-10-CM | POA: Diagnosis not present

## 2022-01-01 DIAGNOSIS — M549 Dorsalgia, unspecified: Secondary | ICD-10-CM | POA: Diagnosis not present

## 2022-01-01 DIAGNOSIS — M47816 Spondylosis without myelopathy or radiculopathy, lumbar region: Secondary | ICD-10-CM | POA: Diagnosis not present

## 2022-01-01 DIAGNOSIS — M545 Low back pain, unspecified: Secondary | ICD-10-CM | POA: Diagnosis not present

## 2022-01-30 DIAGNOSIS — G629 Polyneuropathy, unspecified: Secondary | ICD-10-CM | POA: Diagnosis not present

## 2022-02-13 DIAGNOSIS — M545 Low back pain, unspecified: Secondary | ICD-10-CM | POA: Diagnosis not present

## 2022-02-19 DIAGNOSIS — M533 Sacrococcygeal disorders, not elsewhere classified: Secondary | ICD-10-CM | POA: Diagnosis not present

## 2022-03-13 DIAGNOSIS — M533 Sacrococcygeal disorders, not elsewhere classified: Secondary | ICD-10-CM | POA: Diagnosis not present

## 2022-04-05 DIAGNOSIS — K219 Gastro-esophageal reflux disease without esophagitis: Secondary | ICD-10-CM | POA: Diagnosis not present

## 2022-04-05 DIAGNOSIS — Z2821 Immunization not carried out because of patient refusal: Secondary | ICD-10-CM | POA: Diagnosis not present

## 2022-04-05 DIAGNOSIS — I700 Atherosclerosis of aorta: Secondary | ICD-10-CM | POA: Diagnosis not present

## 2022-04-05 DIAGNOSIS — E669 Obesity, unspecified: Secondary | ICD-10-CM | POA: Diagnosis not present

## 2022-04-05 DIAGNOSIS — E7800 Pure hypercholesterolemia, unspecified: Secondary | ICD-10-CM | POA: Diagnosis not present

## 2022-04-05 DIAGNOSIS — Z1231 Encounter for screening mammogram for malignant neoplasm of breast: Secondary | ICD-10-CM | POA: Diagnosis not present

## 2022-04-05 DIAGNOSIS — Z6829 Body mass index (BMI) 29.0-29.9, adult: Secondary | ICD-10-CM | POA: Diagnosis not present

## 2022-04-05 DIAGNOSIS — R7303 Prediabetes: Secondary | ICD-10-CM | POA: Diagnosis not present

## 2022-04-05 DIAGNOSIS — I10 Essential (primary) hypertension: Secondary | ICD-10-CM | POA: Diagnosis not present

## 2022-04-06 DIAGNOSIS — M47816 Spondylosis without myelopathy or radiculopathy, lumbar region: Secondary | ICD-10-CM | POA: Diagnosis not present

## 2022-04-25 DIAGNOSIS — M47816 Spondylosis without myelopathy or radiculopathy, lumbar region: Secondary | ICD-10-CM | POA: Diagnosis not present

## 2022-05-22 DIAGNOSIS — M47816 Spondylosis without myelopathy or radiculopathy, lumbar region: Secondary | ICD-10-CM | POA: Diagnosis not present

## 2022-06-07 DIAGNOSIS — M47816 Spondylosis without myelopathy or radiculopathy, lumbar region: Secondary | ICD-10-CM | POA: Diagnosis not present

## 2022-06-21 DIAGNOSIS — M47816 Spondylosis without myelopathy or radiculopathy, lumbar region: Secondary | ICD-10-CM | POA: Diagnosis not present

## 2022-07-06 DIAGNOSIS — M47816 Spondylosis without myelopathy or radiculopathy, lumbar region: Secondary | ICD-10-CM | POA: Diagnosis not present

## 2022-07-19 DIAGNOSIS — M47816 Spondylosis without myelopathy or radiculopathy, lumbar region: Secondary | ICD-10-CM | POA: Diagnosis not present

## 2022-08-01 DIAGNOSIS — F419 Anxiety disorder, unspecified: Secondary | ICD-10-CM | POA: Diagnosis not present

## 2022-08-01 DIAGNOSIS — I10 Essential (primary) hypertension: Secondary | ICD-10-CM | POA: Diagnosis not present

## 2022-08-01 DIAGNOSIS — E119 Type 2 diabetes mellitus without complications: Secondary | ICD-10-CM | POA: Diagnosis not present

## 2022-08-01 DIAGNOSIS — N390 Urinary tract infection, site not specified: Secondary | ICD-10-CM | POA: Diagnosis not present

## 2022-08-17 DIAGNOSIS — M47816 Spondylosis without myelopathy or radiculopathy, lumbar region: Secondary | ICD-10-CM | POA: Diagnosis not present

## 2022-08-20 DIAGNOSIS — Z79899 Other long term (current) drug therapy: Secondary | ICD-10-CM | POA: Diagnosis not present

## 2022-08-20 DIAGNOSIS — E7800 Pure hypercholesterolemia, unspecified: Secondary | ICD-10-CM | POA: Diagnosis not present

## 2022-08-20 DIAGNOSIS — R7301 Impaired fasting glucose: Secondary | ICD-10-CM | POA: Diagnosis not present

## 2022-08-20 DIAGNOSIS — I10 Essential (primary) hypertension: Secondary | ICD-10-CM | POA: Diagnosis not present

## 2022-09-07 DIAGNOSIS — M47816 Spondylosis without myelopathy or radiculopathy, lumbar region: Secondary | ICD-10-CM | POA: Diagnosis not present

## 2022-09-26 DIAGNOSIS — M47816 Spondylosis without myelopathy or radiculopathy, lumbar region: Secondary | ICD-10-CM | POA: Diagnosis not present

## 2022-10-10 DIAGNOSIS — M47816 Spondylosis without myelopathy or radiculopathy, lumbar region: Secondary | ICD-10-CM | POA: Diagnosis not present

## 2022-10-11 DIAGNOSIS — Z0000 Encounter for general adult medical examination without abnormal findings: Secondary | ICD-10-CM | POA: Diagnosis not present

## 2022-10-11 DIAGNOSIS — Z1389 Encounter for screening for other disorder: Secondary | ICD-10-CM | POA: Diagnosis not present

## 2022-10-11 DIAGNOSIS — F330 Major depressive disorder, recurrent, mild: Secondary | ICD-10-CM | POA: Diagnosis not present

## 2022-10-11 DIAGNOSIS — E7800 Pure hypercholesterolemia, unspecified: Secondary | ICD-10-CM | POA: Diagnosis not present

## 2022-10-11 DIAGNOSIS — K219 Gastro-esophageal reflux disease without esophagitis: Secondary | ICD-10-CM | POA: Diagnosis not present

## 2022-10-11 DIAGNOSIS — I700 Atherosclerosis of aorta: Secondary | ICD-10-CM | POA: Diagnosis not present

## 2022-10-11 DIAGNOSIS — R7301 Impaired fasting glucose: Secondary | ICD-10-CM | POA: Diagnosis not present

## 2022-10-11 DIAGNOSIS — N1831 Chronic kidney disease, stage 3a: Secondary | ICD-10-CM | POA: Diagnosis not present

## 2022-10-11 DIAGNOSIS — I10 Essential (primary) hypertension: Secondary | ICD-10-CM | POA: Diagnosis not present

## 2022-10-26 DIAGNOSIS — M47816 Spondylosis without myelopathy or radiculopathy, lumbar region: Secondary | ICD-10-CM | POA: Diagnosis not present

## 2022-11-14 DIAGNOSIS — M47816 Spondylosis without myelopathy or radiculopathy, lumbar region: Secondary | ICD-10-CM | POA: Diagnosis not present

## 2022-11-15 DIAGNOSIS — Z1231 Encounter for screening mammogram for malignant neoplasm of breast: Secondary | ICD-10-CM | POA: Diagnosis not present

## 2022-11-15 DIAGNOSIS — M85852 Other specified disorders of bone density and structure, left thigh: Secondary | ICD-10-CM | POA: Diagnosis not present

## 2022-11-15 DIAGNOSIS — M8589 Other specified disorders of bone density and structure, multiple sites: Secondary | ICD-10-CM | POA: Diagnosis not present

## 2022-12-08 DIAGNOSIS — Z20822 Contact with and (suspected) exposure to covid-19: Secondary | ICD-10-CM | POA: Diagnosis not present

## 2022-12-08 DIAGNOSIS — E119 Type 2 diabetes mellitus without complications: Secondary | ICD-10-CM | POA: Diagnosis not present

## 2022-12-08 DIAGNOSIS — F419 Anxiety disorder, unspecified: Secondary | ICD-10-CM | POA: Diagnosis not present

## 2022-12-08 DIAGNOSIS — I10 Essential (primary) hypertension: Secondary | ICD-10-CM | POA: Diagnosis not present

## 2022-12-08 DIAGNOSIS — J019 Acute sinusitis, unspecified: Secondary | ICD-10-CM | POA: Diagnosis not present

## 2022-12-08 DIAGNOSIS — S161XXA Strain of muscle, fascia and tendon at neck level, initial encounter: Secondary | ICD-10-CM | POA: Diagnosis not present

## 2022-12-18 DIAGNOSIS — M47812 Spondylosis without myelopathy or radiculopathy, cervical region: Secondary | ICD-10-CM | POA: Diagnosis not present

## 2022-12-18 DIAGNOSIS — M47816 Spondylosis without myelopathy or radiculopathy, lumbar region: Secondary | ICD-10-CM | POA: Diagnosis not present

## 2022-12-24 DIAGNOSIS — H903 Sensorineural hearing loss, bilateral: Secondary | ICD-10-CM | POA: Diagnosis not present

## 2022-12-24 DIAGNOSIS — H6983 Other specified disorders of Eustachian tube, bilateral: Secondary | ICD-10-CM | POA: Diagnosis not present

## 2022-12-26 DIAGNOSIS — M47812 Spondylosis without myelopathy or radiculopathy, cervical region: Secondary | ICD-10-CM | POA: Diagnosis not present

## 2023-01-10 DIAGNOSIS — R7303 Prediabetes: Secondary | ICD-10-CM | POA: Diagnosis not present

## 2023-01-10 DIAGNOSIS — Z2821 Immunization not carried out because of patient refusal: Secondary | ICD-10-CM | POA: Diagnosis not present

## 2023-01-10 DIAGNOSIS — E669 Obesity, unspecified: Secondary | ICD-10-CM | POA: Diagnosis not present

## 2023-01-10 DIAGNOSIS — Z6830 Body mass index (BMI) 30.0-30.9, adult: Secondary | ICD-10-CM | POA: Diagnosis not present

## 2023-01-10 DIAGNOSIS — I10 Essential (primary) hypertension: Secondary | ICD-10-CM | POA: Diagnosis not present

## 2023-01-10 DIAGNOSIS — M5459 Other low back pain: Secondary | ICD-10-CM | POA: Diagnosis not present

## 2023-01-10 DIAGNOSIS — M1831 Unilateral post-traumatic osteoarthritis of first carpometacarpal joint, right hand: Secondary | ICD-10-CM | POA: Diagnosis not present

## 2023-01-10 DIAGNOSIS — N1831 Chronic kidney disease, stage 3a: Secondary | ICD-10-CM | POA: Diagnosis not present

## 2023-01-10 DIAGNOSIS — E7800 Pure hypercholesterolemia, unspecified: Secondary | ICD-10-CM | POA: Diagnosis not present

## 2023-01-18 DIAGNOSIS — M47812 Spondylosis without myelopathy or radiculopathy, cervical region: Secondary | ICD-10-CM | POA: Diagnosis not present

## 2023-02-27 DIAGNOSIS — M47812 Spondylosis without myelopathy or radiculopathy, cervical region: Secondary | ICD-10-CM | POA: Diagnosis not present

## 2023-03-22 DIAGNOSIS — M47812 Spondylosis without myelopathy or radiculopathy, cervical region: Secondary | ICD-10-CM | POA: Diagnosis not present

## 2023-04-18 DIAGNOSIS — Z23 Encounter for immunization: Secondary | ICD-10-CM | POA: Diagnosis not present

## 2023-04-18 DIAGNOSIS — R7303 Prediabetes: Secondary | ICD-10-CM | POA: Diagnosis not present

## 2023-04-18 DIAGNOSIS — N1831 Chronic kidney disease, stage 3a: Secondary | ICD-10-CM | POA: Diagnosis not present

## 2023-04-18 DIAGNOSIS — I129 Hypertensive chronic kidney disease with stage 1 through stage 4 chronic kidney disease, or unspecified chronic kidney disease: Secondary | ICD-10-CM | POA: Diagnosis not present

## 2023-04-18 DIAGNOSIS — Z6829 Body mass index (BMI) 29.0-29.9, adult: Secondary | ICD-10-CM | POA: Diagnosis not present

## 2023-04-18 DIAGNOSIS — E663 Overweight: Secondary | ICD-10-CM | POA: Diagnosis not present

## 2023-04-18 DIAGNOSIS — I10 Essential (primary) hypertension: Secondary | ICD-10-CM | POA: Diagnosis not present

## 2023-04-18 DIAGNOSIS — E7800 Pure hypercholesterolemia, unspecified: Secondary | ICD-10-CM | POA: Diagnosis not present

## 2023-04-18 DIAGNOSIS — M542 Cervicalgia: Secondary | ICD-10-CM | POA: Diagnosis not present

## 2023-06-11 DIAGNOSIS — M47812 Spondylosis without myelopathy or radiculopathy, cervical region: Secondary | ICD-10-CM | POA: Diagnosis not present

## 2023-07-03 DIAGNOSIS — M47812 Spondylosis without myelopathy or radiculopathy, cervical region: Secondary | ICD-10-CM | POA: Diagnosis not present

## 2023-07-09 DIAGNOSIS — H00025 Hordeolum internum left lower eyelid: Secondary | ICD-10-CM | POA: Diagnosis not present

## 2023-07-17 DIAGNOSIS — M47812 Spondylosis without myelopathy or radiculopathy, cervical region: Secondary | ICD-10-CM | POA: Diagnosis not present

## 2023-07-29 DIAGNOSIS — M47812 Spondylosis without myelopathy or radiculopathy, cervical region: Secondary | ICD-10-CM | POA: Diagnosis not present

## 2023-09-12 DIAGNOSIS — K219 Gastro-esophageal reflux disease without esophagitis: Secondary | ICD-10-CM | POA: Diagnosis not present

## 2023-09-12 DIAGNOSIS — I129 Hypertensive chronic kidney disease with stage 1 through stage 4 chronic kidney disease, or unspecified chronic kidney disease: Secondary | ICD-10-CM | POA: Diagnosis not present

## 2023-09-12 DIAGNOSIS — E663 Overweight: Secondary | ICD-10-CM | POA: Diagnosis not present

## 2023-09-12 DIAGNOSIS — I10 Essential (primary) hypertension: Secondary | ICD-10-CM | POA: Diagnosis not present

## 2023-09-12 DIAGNOSIS — R7303 Prediabetes: Secondary | ICD-10-CM | POA: Diagnosis not present

## 2023-09-12 DIAGNOSIS — Z6829 Body mass index (BMI) 29.0-29.9, adult: Secondary | ICD-10-CM | POA: Diagnosis not present

## 2023-09-12 DIAGNOSIS — E7800 Pure hypercholesterolemia, unspecified: Secondary | ICD-10-CM | POA: Diagnosis not present

## 2023-09-12 DIAGNOSIS — N1832 Chronic kidney disease, stage 3b: Secondary | ICD-10-CM | POA: Diagnosis not present

## 2023-09-12 DIAGNOSIS — I700 Atherosclerosis of aorta: Secondary | ICD-10-CM | POA: Diagnosis not present

## 2023-10-28 DIAGNOSIS — Z6830 Body mass index (BMI) 30.0-30.9, adult: Secondary | ICD-10-CM | POA: Diagnosis not present

## 2023-10-28 DIAGNOSIS — F3342 Major depressive disorder, recurrent, in full remission: Secondary | ICD-10-CM | POA: Diagnosis not present

## 2023-10-28 DIAGNOSIS — I700 Atherosclerosis of aorta: Secondary | ICD-10-CM | POA: Diagnosis not present

## 2023-10-28 DIAGNOSIS — Z1389 Encounter for screening for other disorder: Secondary | ICD-10-CM | POA: Diagnosis not present

## 2023-10-28 DIAGNOSIS — E669 Obesity, unspecified: Secondary | ICD-10-CM | POA: Diagnosis not present

## 2023-10-28 DIAGNOSIS — N1832 Chronic kidney disease, stage 3b: Secondary | ICD-10-CM | POA: Diagnosis not present

## 2023-10-28 DIAGNOSIS — Z0000 Encounter for general adult medical examination without abnormal findings: Secondary | ICD-10-CM | POA: Diagnosis not present

## 2023-12-03 DIAGNOSIS — Z1231 Encounter for screening mammogram for malignant neoplasm of breast: Secondary | ICD-10-CM | POA: Diagnosis not present

## 2023-12-03 DIAGNOSIS — N1832 Chronic kidney disease, stage 3b: Secondary | ICD-10-CM | POA: Diagnosis not present

## 2023-12-03 DIAGNOSIS — N189 Chronic kidney disease, unspecified: Secondary | ICD-10-CM | POA: Diagnosis not present

## 2024-01-09 DIAGNOSIS — Z6829 Body mass index (BMI) 29.0-29.9, adult: Secondary | ICD-10-CM | POA: Diagnosis not present

## 2024-01-09 DIAGNOSIS — E7800 Pure hypercholesterolemia, unspecified: Secondary | ICD-10-CM | POA: Diagnosis not present

## 2024-01-09 DIAGNOSIS — E663 Overweight: Secondary | ICD-10-CM | POA: Diagnosis not present

## 2024-01-09 DIAGNOSIS — N1832 Chronic kidney disease, stage 3b: Secondary | ICD-10-CM | POA: Diagnosis not present

## 2024-01-09 DIAGNOSIS — I10 Essential (primary) hypertension: Secondary | ICD-10-CM | POA: Diagnosis not present

## 2024-01-09 DIAGNOSIS — E1169 Type 2 diabetes mellitus with other specified complication: Secondary | ICD-10-CM | POA: Diagnosis not present

## 2024-01-09 DIAGNOSIS — I129 Hypertensive chronic kidney disease with stage 1 through stage 4 chronic kidney disease, or unspecified chronic kidney disease: Secondary | ICD-10-CM | POA: Diagnosis not present

## 2024-01-09 DIAGNOSIS — E119 Type 2 diabetes mellitus without complications: Secondary | ICD-10-CM | POA: Diagnosis not present

## 2024-01-09 DIAGNOSIS — I700 Atherosclerosis of aorta: Secondary | ICD-10-CM | POA: Diagnosis not present

## 2024-01-09 DIAGNOSIS — G629 Polyneuropathy, unspecified: Secondary | ICD-10-CM | POA: Diagnosis not present

## 2024-01-27 DIAGNOSIS — M47812 Spondylosis without myelopathy or radiculopathy, cervical region: Secondary | ICD-10-CM | POA: Diagnosis not present

## 2024-02-13 DIAGNOSIS — M5412 Radiculopathy, cervical region: Secondary | ICD-10-CM | POA: Diagnosis not present

## 2024-02-13 DIAGNOSIS — Z6829 Body mass index (BMI) 29.0-29.9, adult: Secondary | ICD-10-CM | POA: Diagnosis not present

## 2024-02-13 DIAGNOSIS — M48062 Spinal stenosis, lumbar region with neurogenic claudication: Secondary | ICD-10-CM | POA: Diagnosis not present

## 2024-06-08 ENCOUNTER — Ambulatory Visit: Admitting: Diagnostic Neuroimaging
